# Patient Record
Sex: Male | Born: 1955 | Race: White | Hispanic: No | Marital: Married | Smoking: Never smoker
Health system: Southern US, Community
[De-identification: ages and names within clinical notes are randomized; demographics above are authoritative.]

## PROBLEM LIST (undated history)

## (undated) DIAGNOSIS — E039 Hypothyroidism, unspecified: Secondary | ICD-10-CM

## (undated) DIAGNOSIS — E785 Hyperlipidemia, unspecified: Secondary | ICD-10-CM

## (undated) DIAGNOSIS — I10 Essential (primary) hypertension: Secondary | ICD-10-CM

## (undated) DIAGNOSIS — K635 Polyp of colon: Secondary | ICD-10-CM

## (undated) DIAGNOSIS — Q2547 Right aortic arch: Secondary | ICD-10-CM

## (undated) DIAGNOSIS — Z87442 Personal history of urinary calculi: Secondary | ICD-10-CM

## (undated) HISTORY — PX: COLONOSCOPY: SHX174

## (undated) HISTORY — DX: Right aortic arch: Q25.47

## (undated) HISTORY — DX: Polyp of colon: K63.5

## (undated) HISTORY — DX: Hyperlipidemia, unspecified: E78.5

## (undated) HISTORY — DX: Hypothyroidism, unspecified: E03.9

## (undated) HISTORY — DX: Essential (primary) hypertension: I10

---
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
## 1985-11-15 HISTORY — PX: VASECTOMY: SHX75

## 2000-11-15 HISTORY — PX: LUMBAR LAMINECTOMY: SHX95

## 2002-01-13 DIAGNOSIS — K635 Polyp of colon: Secondary | ICD-10-CM

## 2002-01-13 HISTORY — DX: Polyp of colon: K63.5

## 2010-05-29 ENCOUNTER — Ambulatory Visit (HOSPITAL_COMMUNITY): Admission: RE | Admit: 2010-05-29 | Discharge: 2010-05-29 | Payer: Self-pay | Admitting: Orthopedic Surgery

## 2011-07-12 ENCOUNTER — Encounter: Payer: Self-pay | Admitting: Cardiology

## 2011-07-13 ENCOUNTER — Encounter: Payer: Self-pay | Admitting: Cardiology

## 2011-07-13 ENCOUNTER — Ambulatory Visit (INDEPENDENT_AMBULATORY_CARE_PROVIDER_SITE_OTHER): Payer: PRIVATE HEALTH INSURANCE | Admitting: Cardiology

## 2011-07-13 VITALS — BP 152/104 | HR 71 | Resp 16 | Ht 71.0 in | Wt 186.0 lb

## 2011-07-13 DIAGNOSIS — R0602 Shortness of breath: Secondary | ICD-10-CM

## 2011-07-13 DIAGNOSIS — I10 Essential (primary) hypertension: Secondary | ICD-10-CM

## 2011-07-13 DIAGNOSIS — R0609 Other forms of dyspnea: Secondary | ICD-10-CM

## 2011-07-13 DIAGNOSIS — R0600 Dyspnea, unspecified: Secondary | ICD-10-CM

## 2011-07-13 DIAGNOSIS — R0989 Other specified symptoms and signs involving the circulatory and respiratory systems: Secondary | ICD-10-CM

## 2011-07-13 NOTE — Patient Instructions (Signed)
Your physician has requested that you have an exercise tolerance test. For further information please visit [email or web address] Please also follow instruction sheet, as given.  Please have lab work drawn today.  The current medical regimen is effective;  continue present plan and medications.

## 2011-07-13 NOTE — Assessment & Plan Note (Signed)
The blood pressure continues to be high. I have instructed the patient to record a blood pressure diary and recording this. This will be presented for my review and pending these results I will make further suggestions about changes in therapy for optimal blood pressure control.  

## 2011-07-13 NOTE — Progress Notes (Signed)
HPI  No Known Allergies  No current outpatient prescriptions on file.    Past Medical History  Diagnosis Date  . SOB (shortness of breath) on exertion   . Hyperlipidemia   . Colon polyps   . Kidney stones   . Hypothyroidism     Past Surgical History  Procedure Date  . Pelvic fracture surgery   . Vasectomy 1987  . Lumbar laminectomy 2002    Family History  Problem Relation Age of Onset  . Prostate cancer Other   . Breast cancer Other   . Pancreatic cancer Other   . Breast cancer Mother   . Hypertension Mother   . Ulcers Mother   . Ulcers Father   . Prostate cancer Father     History   Social History  . Marital Status: Married    Spouse Name: N/A    Number of Children: 3  . Years of Education: N/A   Occupational History  . 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 Smoking status: Never Smoker   . Smokeless tobacco: Never Used  . Alcohol Use: Not on file  . Drug Use: Not on file  . Sexually Active: Not on file   Other Topics Concern  . Not on file   Social History Narrative  . No narrative on file    ROS:  As stated in the HPI and negative for all other systems.  PHYSICAL EXAM BP 152/104  Pulse 71  Resp 16  Ht 5\' 11"  (1.803 m)  Wt 186 lb (84.369 kg)  BMI 25.94 kg/m2 GENERAL:  Well appearing HEENT:  Pupils equal round and reactive, fundi not visualized, oral mucosa unremarkable NECK:  No jugular venous distention, waveform within normal limits, carotid upstroke brisk and symmetric, no bruits, no thyromegaly LYMPHATICS:  No cervical, inguinal adenopathy LUNGS:  Clear to auscultation bilaterally BACK:  No CVA tenderness CHEST:  Unremarkable HEART:  PMI not displaced or sustained,S1 and S2 within normal limits, no S3, no S4, no clicks, no rubs, no murmurs ABD:  Flat, positive bowel sounds normal in frequency in pitch, no bruits, no rebound, no guarding, no midline pulsatile mass, no hepatomegaly, no splenomegaly EXT:  2 plus pulses  throughout, no edema, no cyanosis no clubbing SKIN:  No rashes no nodules NEURO:  Cranial nerves II through XII grossly intact, motor grossly intact throughout PSYCH:  Cognitively intact, oriented to person place and time  EKG:  Sinus rhythm, rate 58, axis leftward, intervals within normal limits, no acute ST-T wave changes.  ASSESSMENT AND PLAN

## 2011-07-13 NOTE — Assessment & Plan Note (Signed)
I do not suspect obstructive CAD but I would like to evaluate with an ETT.  In particular I am concerned about his blood pressure response with exercise.  I will arrange to bring him back for this.  I will also check a BNP level.

## 2011-07-17 HISTORY — PX: OTHER SURGICAL HISTORY: SHX169

## 2011-07-23 ENCOUNTER — Ambulatory Visit (INDEPENDENT_AMBULATORY_CARE_PROVIDER_SITE_OTHER): Payer: Self-pay | Admitting: Cardiology

## 2011-07-23 DIAGNOSIS — I10 Essential (primary) hypertension: Secondary | ICD-10-CM

## 2011-07-23 DIAGNOSIS — R0600 Dyspnea, unspecified: Secondary | ICD-10-CM

## 2011-07-23 DIAGNOSIS — R0609 Other forms of dyspnea: Secondary | ICD-10-CM

## 2011-07-23 MED ORDER — HYDROCHLOROTHIAZIDE 12.5 MG PO CAPS: 12.5000 mg | ORAL_CAPSULE | Freq: Every day | ORAL | Status: DC

## 2011-07-23 NOTE — Patient Instructions (Signed)
Please start Chlorthalidone 12.5 mg once a day for blood pressure.  You are being scheduled for a Ca Score.  You will be given instructions and the date and time at check out.

## 2011-07-23 NOTE — Progress Notes (Signed)
Exercise Treadmill Test  Pre-Exercise Testing Evaluation Rhythm: normal sinus  Rate: 76   PR:  .16 QRS:  .10  QT:  .38 QTc: .42     Test  Exercise Tolerance Test Ordering MD: Angelina Sheriff, MD  Interpreting MD:  Angelina Sheriff, MD  Unique Test No: 1  Treadmill:  1  Indication for ETT: exertional dyspnea  Contraindication to ETT: No   Stress Modality: exercise - treadmill  Cardiac Imaging Performed: non   Protocol: standard Bruce - maximal  Max BP:  225/108  Max MPHR (bpm):  165 85% MPR (bpm):  140  MPHR obtained (bpm):  169 % MPHR obtained:  102  Reached 85% MPHR (min:sec):  7:26 Total Exercise Time (min-sec):  10:47  Workload in METS:  14.8 Borg Scale: 17  Reason ETT Terminated:  desired heart rate attained    ST Segment Analysis At Rest: normal ST segments - no evidence of significant ST depression With Exercise: no evidence of significant ST depression  Other Information Arrhythmia:  No Angina during ETT:  absent (0) Quality of ETT:  diagnostic  ETT Interpretation:  normal - no evidence of ischemia by ST analysis  Comments: I used an accelerated Bruce protocol.  The patient had an excellent exercise tolerance.  There was no chest pain. There were no arrhythmias and a normal heart rate response.  There were no ischemic ST T wave changes and a normal heart rate recovery.  He did have an accelerated BP response especially at the highest level of exercise.  In stage 5 when his heart rate reached the peak he had a sudden spike in his BP and acute dyspnea.  His BP remained somewhat elevated but came down with recovery.  His dyspnea resolved very quickly  Recommendations: Negative adequate ETT.  I see no evidence of ischemia.  However, he did have acute dyspnea.  This may be related to his BP response.  I reviewed his resting readings and they are more often above 140/90 than not.  Given this, the symptoms and the response with exercise I will begin him on a low dose of chlorthalidone.   I have prescribed a lower level of exercise than he was doing but increased frequency during the week.  He will keep a BP diary and check it with or immediately after exercise.  Though I see no evidence of obstructive CAD, I have suggested a CT calcium score because of the symptoms.  Finally, if this continues in the future I will order CPX testing.

## 2011-07-25 ENCOUNTER — Emergency Department (HOSPITAL_COMMUNITY): Payer: PRIVATE HEALTH INSURANCE

## 2011-07-25 ENCOUNTER — Emergency Department (HOSPITAL_COMMUNITY)
Admission: EM | Admit: 2011-07-25 | Discharge: 2011-07-25 | Disposition: A | Discharge status: Home / Self Care | Payer: PRIVATE HEALTH INSURANCE | Attending: Emergency Medicine | Admitting: Emergency Medicine

## 2011-07-25 DIAGNOSIS — Y92838 Other recreation area as the place of occurrence of the external cause: Secondary | ICD-10-CM | POA: Insufficient documentation

## 2011-07-25 DIAGNOSIS — E039 Hypothyroidism, unspecified: Secondary | ICD-10-CM | POA: Insufficient documentation

## 2011-07-25 DIAGNOSIS — S52509A Unspecified fracture of the lower end of unspecified radius, initial encounter for closed fracture: Secondary | ICD-10-CM | POA: Insufficient documentation

## 2011-07-25 DIAGNOSIS — Y9322 Activity, ice hockey: Secondary | ICD-10-CM | POA: Insufficient documentation

## 2011-07-25 DIAGNOSIS — I10 Essential (primary) hypertension: Secondary | ICD-10-CM | POA: Insufficient documentation

## 2011-07-25 DIAGNOSIS — Y9239 Other specified sports and athletic area as the place of occurrence of the external cause: Secondary | ICD-10-CM | POA: Insufficient documentation

## 2011-07-25 DIAGNOSIS — W010XXA Fall on same level from slipping, tripping and stumbling without subsequent striking against object, initial encounter: Secondary | ICD-10-CM | POA: Insufficient documentation

## 2011-07-25 LAB — BASIC METABOLIC PANEL
BUN: 22 mg/dL (ref 6–23)
Calcium: 9.9 mg/dL (ref 8.4–10.5)
GFR calc Af Amer: >60 mL/min (ref >60)
GFR calc non Af Amer: 55 mL/min — ABNORMAL LOW (ref >60)
Glucose, Bld: 108 mg/dL — ABNORMAL HIGH (ref 70–99)

## 2011-07-25 LAB — DIFFERENTIAL
Lymphocytes Relative: 8 % — ABNORMAL LOW (ref 12–46)
Monocytes Absolute: 1.1 10*3/uL — ABNORMAL HIGH (ref 0.1–1.0)
Neutro Abs: 13.1 10*3/uL — ABNORMAL HIGH (ref 1.7–7.7)
Neutrophils Relative %: 84 % — ABNORMAL HIGH (ref 43–77)

## 2011-07-25 LAB — PROTIME-INR
INR: 0.94 (ref 0.00–1.49)
Prothrombin Time: 12.8 seconds (ref 11.6–15.2)

## 2011-07-25 LAB — CBC
HCT: 47.9 % (ref 39.0–52.0)
MCH: 31.6 pg (ref 26.0–34.0)
MCHC: 35.7 g/dL (ref 30.0–36.0)
Platelets: 182 10*3/uL (ref 150–400)
RDW: 12.9 % (ref 11.5–15.5)

## 2011-07-27 ENCOUNTER — Ambulatory Visit (HOSPITAL_BASED_OUTPATIENT_CLINIC_OR_DEPARTMENT_OTHER)
Admission: RE | Admit: 2011-07-27 | Discharge: 2011-07-27 | Disposition: A | Discharge status: Home / Self Care | Payer: PRIVATE HEALTH INSURANCE | Source: Ambulatory Visit | Attending: Orthopedic Surgery | Admitting: Orthopedic Surgery

## 2011-07-27 DIAGNOSIS — S52509A Unspecified fracture of the lower end of unspecified radius, initial encounter for closed fracture: Secondary | ICD-10-CM | POA: Insufficient documentation

## 2011-07-27 DIAGNOSIS — Y9322 Activity, ice hockey: Secondary | ICD-10-CM | POA: Insufficient documentation

## 2011-07-27 DIAGNOSIS — S52609A Unspecified fracture of lower end of unspecified ulna, initial encounter for closed fracture: Secondary | ICD-10-CM | POA: Insufficient documentation

## 2011-07-27 DIAGNOSIS — Y998 Other external cause status: Secondary | ICD-10-CM | POA: Insufficient documentation

## 2011-07-27 DIAGNOSIS — W010XXA Fall on same level from slipping, tripping and stumbling without subsequent striking against object, initial encounter: Secondary | ICD-10-CM | POA: Insufficient documentation

## 2011-07-27 DIAGNOSIS — Y9229 Other specified public building as the place of occurrence of the external cause: Secondary | ICD-10-CM | POA: Insufficient documentation

## 2011-07-28 NOTE — Consult Note (Signed)
NAMEJANOAH, William Wang NO.:  [id number]  MEDICAL RECORD NO.:  [id number]  LOCATION:  [facility]                         FACILITY:  [facility]  PHYSICIAN:  Betha Loa, MD        DATE OF BIRTH:  [birth year]  DATE OF CONSULTATION:  07/25/2011 DATE OF DISCHARGE:  07/25/2011                                CONSULTATION   Consult is from Dr. Effie Shy.  REASON FOR CONSULTATION:  Right distal radius fracture.  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  He had pain and deformity in the right wrist.  He came to [facility] Emergency Department where he was evaluated.  He was found to have a distal radius fracture and I was consulted for management of injury.  He reports he was wearing pads and gloves when he fell.  He reports previous gamekeeper's thumb injury approximately 24 years ago, but no other injuries to the right upper extremity.  He complains of no other injuries at this time.  ALLERGIES:  No known drug allergies.  PAST MEDICAL HISTORY:  Borderline hypothyroid and newly diagnosed hypertension.  PAST SURGICAL HISTORY:  Lumbar laminectomy in 2003 and vasectomy.  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  He does not smoke and has a glass of wine a day.  FAMILY HISTORY:  Positive for cancer, coronary artery disease, and ulcers.  REVIEW OF SYSTEMS:  13-point review of systems negative.  PHYSICAL EXAMINATION:  VITAL SIGNS:  Temperature 98.7, pulse 66, respirations 16, BP 144/94. HEAD:  Normocephalic, atraumatic. NECK:  Supple.  Full range of motion.  He is resting comfortably in the hospital stretcher. EXTREMITIES:  Bilateral upper extremities are intact to light touch sensation and capillary refill in all fingertips.  He can flex & extend the IP joints of his thumbs and can cross and abduct his fingers.  Left upper extremity is  without wounds without tenderness to palpation.  In the right upper extremity, he has a very small punctate wound, about the size of a 25 gauge needle, that was slowly bleeding at the ulnar side of the wrist.  There are no other wounds.  He is tender at the wrist, but not in the digits, hand, forearm, elbow, or shoulder.  There is visible deformity at the wrist.  RADIOGRAPHIC STUDIES:  AP and lateral oblique views of the right wrist show a comminuted fracture of the distal radius and ulna.  There is an extra-articular fracture of the radius with metaphyseal comminution. There is mild dorsal displacement and angulation.  There is minimal displacement at the ulna.  ASSESSMENT/PLAN:  Right comminuted distal radius fracture and distal ulna fracture.  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  He has been given a gram of IV Ancef by the emergency department and his tetanus is up to date.  We discussed the nature of this injury.  Would recommend p.o. antibiotics and scheduling for surgery within the next couple of days at the outpatient surgical center.  Risks, benefits, and alternatives  of surgery were discussed and he wished to proceed and agreed with plan of care.  A sugar-tong splint was placed, which he states is comfortable and well fitting.  Fingertips were pink with brisk capillary refill after splinting.  We will call him tomorrow with surgical planning.     Betha Loa, MD     KK/MEDQ  D:  07/25/2011  T:  07/26/2011  Job:  [id number]  Electronically Signed by Betha Loa  on 07/28/2011 02:29:38 PM

## 2011-07-28 NOTE — Op Note (Signed)
NAMECAMBREN, HELM NO.:  [id number]  MEDICAL RECORD NO.:  [id number]  LOCATION:                                 FACILITY:  PHYSICIAN:  Betha Loa, MD        DATE OF BIRTH:  [birth year]  DATE OF PROCEDURE:  07/27/2011 DATE OF DISCHARGE:                              OPERATIVE REPORT   PREOPERATIVE DIAGNOSIS:  Right distal radius and ulna fractures.  POSTOPERATIVE DIAGNOSIS:  Right distal radius and ulna fractures.  PROCEDURE:  Open reduction and internal fixation right distal radius fracture and closed reduction and percutaneous pinning of right distal ulna fracture.  SURGEON:  Betha Loa, MD  ASSISTANT:  Cindee Salt, MD  ANESTHESIA:  General with regional IV fluids per anesthesia flow sheet.  ESTIMATED BLOOD LOSS:  Minimal.  COMPLICATIONS:  None.  SPECIMENS:  None.  TOURNIQUET TIME:  88 minutes.  DISPOSITION:  Stable to PACU.  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  He came to the [facility] Emergency Department where radiographs were taken revealing a right distal radius and ulna fracture.  I saw him in the emergency department.  A sugar-tong splint was placed.  We discussed the nature of the injury.  I recommended going to the operating room for open reduction and internal fixation of the fracture.  Risks, benefits and alternatives of surgery were discussed including risk of blood loss, infection, damage to nerves, vessels, tendons, ligaments or bone, failure to surgery, need for additional surgery, complications with wound healing, continued pain, nonunion, malunion, stiffness.  He voiced understanding of these risks and elected to proceed.  OPERATIVE COURSE:  After being identified preoperatively by myself, the patient & I agreed upon the procedure and site of procedure.  The surgical site was marked.  The risks, benefits and alternatives of surgery were reviewed and he wished to proceed.   Surgical consent has been signed. He was given a gram of IV Ancef as preoperative antibiotic prophylaxis. A regional block was performed by Anesthesia in preoperative holding. He was transferred to the operating room and placed in the operating room table in supine position with the right upper extremity on an armboard.  General anesthesia was induced by the anesthesiologist.  The right upper extremity was prepped and draped in normal sterile orthopedic fashion.  A surgical pause was performed between surgeons, Anesthesia, and operating staff and all were in agreement as to the patient, procedure and site of procedure.  Tourniquet at the proximal aspect of the extremity was inflated to 250 mmHg after exsanguination of the limb with an Esmarch bandage.  Standard volar Sherilyn Cooter approach was used.  The superficial and deep portions of the FCR were incised and the FCR retracted ulnarly to protect the palmar cutaneous branch of the median nerve.  The FPL was swept ulnarly as well.  The pronator quadratus was released from the radial side of the radius and elevated with the periosteal elevator.  The fracture site was easily identified. There was no intra-articular comminution, but there was a large butterfly fragment on the radial side.  Brachioradialis was released  to aid in reduction.  The butterfly fragment was reduced and held with a bone clamp.  Two screws were placed from the radial side of the radius across to the ulnar side of the radius.  These were cortical screws.  This secured the fragment well.  Reduction of the articular fragment was performed.  A volar distal radial locking plate was selected and secured to the bone with the guidepins.  C-arm was used in AP, lateral, and oblique projections to ensure appropriate reduction and placement of the hardware which was the case.  The first screw placed  was the proximal most screw in the shaft of the plate prior to placing the distal  screws.  The distal holes in the plate were filled using standard  AO drilling and measuring technique.  Smooth pegs were used in all except  for the two radial styloid holes, which were filled with locking screws.   This stabilized the fracture well.  The remaining holes in the shaft of  the plate were then filled with nonlocking screws.  C-arm was again used  in AP, lateral, and oblique projections to ensure appropriate reduction and placement of hardware, which was the case.  There was no intra-articular penetration.  The distal ulna was then addressed.  A 0.062-inch K-wire was then advanced from the ulnar styloid down the shaft of the ulna.  An additional 0.54- inch K-wire was then advanced in a crossed fashion into the head of the ulna.  This was adequate to stabilize the ulnar fracture.  A 0.062-inch K-wire had been used in the distal radius to aid in stabilization, this had been removed already.  The wounds were then copiously irrigated with sterile saline.  The pins were cut, so that the end would be underneath the skin.  The one through the styloid was bent, so that would not migrate into the shaft of the ulna.  Copious irrigation of the surgicalwound was performed.  The FPL and pronator quadratus were repaired back over top of the plate using 4-0 Vicryl suture in a figure-of-eight fashion.  Three inverted Vicryl sutures were placed in the subcutaneous tissues and the skin was closed with 4-0 nylon in a horizontal mattress fashion.  The wound was dressed with sterile Xeroform and 4x4s, and wrapped with Kerlix.  A sugar-tong splint was placed and wrapped with Kerlix and Ace bandage.  Tourniquet was deflated at 88 minutes.  The fingertips were pink with brisk capillary refill after deflation of the tourniquet.  The operative drapes were broken down.  The patient was awoken from anesthesia safely.  He was transferred back to the stretcher and taken to PACU in stable condition.  I will  give him Percocet 5/325 one to two p.o. q.6 h. p.r.n. pain, dispensed number of 40 and I will see him back in the office in 1 week for postoperative followup.     Betha Loa, MD     KK/MEDQ  D:  07/27/2011  T:  07/28/2011  Job:  [id number]  Electronically Signed by Betha Loa  on 07/28/2011 02:32:40 PM

## 2011-08-05 ENCOUNTER — Encounter: Payer: Self-pay | Admitting: Cardiology

## 2011-08-19 ENCOUNTER — Ambulatory Visit (HOSPITAL_COMMUNITY)
Admission: RE | Admit: 2011-08-19 | Discharge: 2011-08-19 | Disposition: A | Discharge status: Home / Self Care | Payer: PRIVATE HEALTH INSURANCE | Source: Ambulatory Visit | Attending: Cardiology | Admitting: Cardiology

## 2011-08-19 ENCOUNTER — Other Ambulatory Visit: Payer: Self-pay | Admitting: *Deleted

## 2011-08-19 ENCOUNTER — Encounter (HOSPITAL_COMMUNITY): Payer: Self-pay

## 2011-08-19 DIAGNOSIS — R0600 Dyspnea, unspecified: Secondary | ICD-10-CM

## 2011-08-19 DIAGNOSIS — R9389 Abnormal findings on diagnostic imaging of other specified body structures: Secondary | ICD-10-CM | POA: Insufficient documentation

## 2011-08-19 DIAGNOSIS — I10 Essential (primary) hypertension: Secondary | ICD-10-CM

## 2011-08-19 MED ORDER — CHLORTHALIDONE 25 MG PO TABS: 12.5000 mg | ORAL_TABLET | Freq: Every day | ORAL | Status: DC

## 2011-08-24 ENCOUNTER — Encounter: Payer: Self-pay | Admitting: Cardiology

## 2011-11-18 ENCOUNTER — Encounter: Payer: Self-pay | Admitting: Gastroenterology

## 2011-12-15 ENCOUNTER — Encounter: Payer: Self-pay | Admitting: Internal Medicine

## 2011-12-15 ENCOUNTER — Ambulatory Visit (AMBULATORY_SURGERY_CENTER): Payer: PRIVATE HEALTH INSURANCE | Admitting: *Deleted

## 2011-12-15 VITALS — Ht 71.0 in | Wt 185.3 lb

## 2011-12-15 DIAGNOSIS — Z1211 Encounter for screening for malignant neoplasm of colon: Secondary | ICD-10-CM

## 2011-12-15 MED ORDER — PEG-KCL-NACL-NASULF-NA ASC-C 100 G PO SOLR: ORAL | Status: DC

## 2011-12-22 ENCOUNTER — Other Ambulatory Visit: Payer: Self-pay | Admitting: Gastroenterology

## 2011-12-23 ENCOUNTER — Ambulatory Visit (AMBULATORY_SURGERY_CENTER): Payer: PRIVATE HEALTH INSURANCE | Admitting: Internal Medicine

## 2011-12-23 ENCOUNTER — Encounter: Payer: Self-pay | Admitting: Internal Medicine

## 2011-12-23 VITALS — BP 136/90 | HR 48 | Temp 96.9°F | Resp 18 | Ht 71.0 in | Wt 185.0 lb

## 2011-12-23 DIAGNOSIS — K5730 Diverticulosis of large intestine without perforation or abscess without bleeding: Secondary | ICD-10-CM

## 2011-12-23 DIAGNOSIS — Z1211 Encounter for screening for malignant neoplasm of colon: Secondary | ICD-10-CM

## 2011-12-23 MED ORDER — SODIUM CHLORIDE 0.9 % IV SOLN: 500.0000 mL | INTRAVENOUS | Status: DC

## 2011-12-23 NOTE — Patient Instructions (Addendum)
There was diverticulosis in the left colon but no polyps or cancer seen. Next routine colonoscopy in about 10 years. Iva Boop, MD, [facility] FOLLOW DISCHARGE INSTRUCTIONS [facility] AND GREEN SHEETS).

## 2011-12-23 NOTE — Progress Notes (Signed)
Patient did not experience any of the following events: a burn prior to discharge; a fall within the facility; wrong site/side/patient/procedure/implant event; or a hospital transfer or hospital admission upon discharge from the facility. (G8907) Patient did not have preoperative order for IV antibiotic SSI prophylaxis. (G8918) Patient did not have preoperative order for IV antibiotic SSI prophylaxis. (G8918)  

## 2011-12-23 NOTE — Progress Notes (Signed)
Pt states he has a low heart rate normally. ewm

## 2011-12-23 NOTE — Op Note (Signed)
[facility] [address] [place], [place]  [address]  COLONOSCOPY PROCEDURE REPORT  PATIENT:  William Wang, William Wang  MR#:  [id number] BIRTHDATE:  08/07/1956, 55 yrs. old  GENDER:  male ENDOSCOPIST:  Iva Boop, MD, [facility] REF. BY:  Chilton Greathouse, M.D. PROCEDURE DATE:  12/23/2011 PROCEDURE:  Colonoscopy 86578 ASA CLASS:  Class II INDICATIONS:  Routine Risk Screening MEDICATIONS:   These medications were titrated to patient response per physician's verbal order, Fentanyl 50 mcg IV, Versed 5 mg  DESCRIPTION OF PROCEDURE:   After the risks benefits and alternatives of the procedure were thoroughly explained, informed consent was obtained.  Digital rectal exam was performed and revealed no rectal masses and an enlarged prostate.  Mildly enlarged prostate without nodules (previously noted by Dr. Felipa Eth also). The LB 180AL [id number] endoscope was introduced through the anus and advanced to the cecum, which was identified by both the appendix and ileocecal valve, without limitations.  The quality of the prep was good, using MoviPrep.  The instrument was then slowly withdrawn as the colon was fully examined. <<PROCEDUREIMAGES>>  FINDINGS:  Moderate diverticulosis was found in the left colon. This was otherwise a normal examination of the colon. Includes right colon retroflexion.   Retroflexed views in the rectum revealed no abnormalities.    The time to cecum = 2:27 minutes. The scope was then withdrawn in 14:13 minutes from the cecum and the procedure completed. COMPLICATIONS:  None ENDOSCOPIC IMPRESSION: 1) Moderate diverticulosis in the left colon 2) Otherwise normal examination, good prep  REPEAT EXAM:  In 10 year(s) for routine screening colonoscopy.  Iva Boop, MD, [facility]  CC:  Chilton Greathouse, MD and The Patient  n. eSIGNED:   Iva Boop at 12/23/2011 09:24 AM  Dione Booze, [phone]

## 2011-12-24 ENCOUNTER — Telehealth: Payer: Self-pay | Admitting: *Deleted

## 2011-12-24 NOTE — Telephone Encounter (Signed)
  Follow up Call-  Call back number 12/23/2011  Post procedure Call Back phone  # [phone]  Permission to leave phone message Yes     Patient questions:  Do you have a fever, pain , or abdominal swelling? no Pain Score  0 *  Have you tolerated food without any problems? yes  Have you been able to return to your normal activities? yes  Do you have any questions about your discharge instructions: Diet   no Medications  no Follow up visit  no  Do you have questions or concerns about your Care? no  Actions: * If pain score is 4 or above: No action needed, pain <4.

## 2012-10-23 ENCOUNTER — Other Ambulatory Visit: Payer: Self-pay | Admitting: *Deleted

## 2012-10-23 DIAGNOSIS — I10 Essential (primary) hypertension: Secondary | ICD-10-CM

## 2012-10-23 MED ORDER — CHLORTHALIDONE 25 MG PO TABS: 12.5000 mg | ORAL_TABLET | Freq: Every day | ORAL | Status: DC

## 2012-10-25 ENCOUNTER — Other Ambulatory Visit: Payer: Self-pay

## 2012-10-25 DIAGNOSIS — I10 Essential (primary) hypertension: Secondary | ICD-10-CM

## 2012-10-25 MED ORDER — CHLORTHALIDONE 25 MG PO TABS: 12.5000 mg | ORAL_TABLET | Freq: Every day | ORAL | Status: DC

## 2012-10-25 NOTE — Telephone Encounter (Signed)
..   Requested Prescriptions   Signed Prescriptions Disp Refills  . chlorthalidone (HYGROTON) 25 MG tablet 15 tablet 1    Sig: Take 0.5 tablets (12.5 mg total) by mouth daily.    Authorizing Provider: Rollene Rotunda    Ordering User: Christella Hartigan, Kyrstal Monterrosa Judie Petit

## 2013-01-02 ENCOUNTER — Other Ambulatory Visit: Payer: Self-pay

## 2013-01-02 DIAGNOSIS — I10 Essential (primary) hypertension: Secondary | ICD-10-CM

## 2013-01-02 MED ORDER — CHLORTHALIDONE 25 MG PO TABS: 12.5000 mg | ORAL_TABLET | Freq: Every day | ORAL | Status: DC

## 2013-01-02 NOTE — Telephone Encounter (Signed)
..   Requested Prescriptions   Signed Prescriptions Disp Refills  . chlorthalidone (HYGROTON) 25 MG tablet 45 tablet 3    Sig: Take 0.5 tablets (12.5 mg total) by mouth daily.    Authorizing Provider: Rollene Rotunda    Ordering User: Vilma Will M  refilled per Dr Antoine Poche

## 2015-10-27 ENCOUNTER — Other Ambulatory Visit (HOSPITAL_COMMUNITY): Payer: Self-pay | Admitting: Orthopedic Surgery

## 2015-10-27 ENCOUNTER — Ambulatory Visit (HOSPITAL_COMMUNITY)
Admission: RE | Admit: 2015-10-27 | Discharge: 2015-10-27 | Disposition: A | Discharge status: Home / Self Care | Payer: PRIVATE HEALTH INSURANCE | Source: Ambulatory Visit | Attending: Orthopedic Surgery | Admitting: Orthopedic Surgery

## 2015-10-27 DIAGNOSIS — M5489 Other dorsalgia: Secondary | ICD-10-CM | POA: Diagnosis not present

## 2015-10-27 DIAGNOSIS — R200 Anesthesia of skin: Secondary | ICD-10-CM | POA: Diagnosis not present

## 2015-10-27 DIAGNOSIS — M4807 Spinal stenosis, lumbosacral region: Secondary | ICD-10-CM | POA: Diagnosis not present

## 2015-10-27 DIAGNOSIS — M5136 Other intervertebral disc degeneration, lumbar region: Secondary | ICD-10-CM | POA: Diagnosis not present

## 2015-10-27 DIAGNOSIS — N400 Enlarged prostate without lower urinary tract symptoms: Secondary | ICD-10-CM | POA: Insufficient documentation

## 2015-10-27 DIAGNOSIS — M4806 Spinal stenosis, lumbar region: Secondary | ICD-10-CM | POA: Diagnosis not present

## 2015-10-27 DIAGNOSIS — M545 Low back pain: Secondary | ICD-10-CM | POA: Diagnosis not present

## 2015-10-29 ENCOUNTER — Other Ambulatory Visit: Payer: Self-pay | Admitting: Internal Medicine

## 2015-10-29 ENCOUNTER — Other Ambulatory Visit (HOSPITAL_COMMUNITY): Payer: Self-pay | Admitting: Orthopedic Surgery

## 2015-10-29 DIAGNOSIS — M5116 Intervertebral disc disorders with radiculopathy, lumbar region: Secondary | ICD-10-CM

## 2015-10-29 DIAGNOSIS — M4807 Spinal stenosis, lumbosacral region: Secondary | ICD-10-CM

## 2015-11-04 ENCOUNTER — Other Ambulatory Visit: Payer: Self-pay | Admitting: Internal Medicine

## 2015-11-04 ENCOUNTER — Ambulatory Visit
Admission: RE | Admit: 2015-11-04 | Discharge: 2015-11-04 | Disposition: A | Discharge status: Home / Self Care | Payer: PRIVATE HEALTH INSURANCE | Source: Ambulatory Visit | Attending: Internal Medicine | Admitting: Internal Medicine

## 2015-11-04 DIAGNOSIS — M5116 Intervertebral disc disorders with radiculopathy, lumbar region: Secondary | ICD-10-CM

## 2015-11-04 MED ORDER — IOHEXOL 180 MG/ML  SOLN
1.0000 mL | Freq: Once | INTRAMUSCULAR | Status: AC | PRN
  Administered 2015-11-04: 1 mL via EPIDURAL

## 2015-11-04 MED ORDER — METHYLPREDNISOLONE ACETATE 40 MG/ML INJ SUSP (RADIOLOG
120.0000 mg | Freq: Once | INTRAMUSCULAR | Status: AC
Start: 2015-11-04 — End: 2015-11-04
  Administered 2015-11-04: 120 mg via EPIDURAL

## 2015-11-04 NOTE — Discharge Instructions (Signed)

## 2015-11-11 DIAGNOSIS — M5116 Intervertebral disc disorders with radiculopathy, lumbar region: Secondary | ICD-10-CM | POA: Insufficient documentation

## 2015-11-16 HISTORY — PX: LUMBAR MICRODISCECTOMY: SHX99

## 2015-11-28 ENCOUNTER — Other Ambulatory Visit: Payer: Self-pay | Admitting: Neurological Surgery

## 2015-11-28 DIAGNOSIS — G8929 Other chronic pain: Secondary | ICD-10-CM

## 2015-11-28 DIAGNOSIS — M545 Low back pain: Principal | ICD-10-CM

## 2015-12-09 ENCOUNTER — Ambulatory Visit
Admission: RE | Admit: 2015-12-09 | Discharge: 2015-12-09 | Disposition: A | Discharge status: Home / Self Care | Payer: PRIVATE HEALTH INSURANCE | Source: Ambulatory Visit | Attending: Neurological Surgery | Admitting: Neurological Surgery

## 2015-12-09 ENCOUNTER — Encounter: Payer: Self-pay | Admitting: Radiology

## 2015-12-09 DIAGNOSIS — M545 Low back pain: Principal | ICD-10-CM

## 2015-12-09 DIAGNOSIS — G8929 Other chronic pain: Secondary | ICD-10-CM

## 2015-12-09 MED ORDER — METHYLPREDNISOLONE ACETATE 40 MG/ML INJ SUSP (RADIOLOG
120.0000 mg | Freq: Once | INTRAMUSCULAR | Status: AC
  Administered 2015-12-09: 120 mg via EPIDURAL

## 2015-12-09 MED ORDER — IOHEXOL 180 MG/ML  SOLN
1.0000 mL | Freq: Once | INTRAMUSCULAR | Status: AC | PRN
  Administered 2015-12-09: 1 mL via EPIDURAL

## 2015-12-09 NOTE — Discharge Instructions (Signed)

## 2017-01-18 ENCOUNTER — Encounter (HOSPITAL_COMMUNITY): Payer: Self-pay | Admitting: *Deleted

## 2017-02-01 ENCOUNTER — Other Ambulatory Visit (HOSPITAL_COMMUNITY): Payer: Self-pay | Admitting: Emergency Medicine

## 2017-02-01 NOTE — Patient Instructions (Signed)
William Wang  [birth year]   Your procedure is scheduled on: 02-08-17  Report to [facility] Main  Entrance take [facility]  elevators to 3rd floor to  [place] at [phone].  Call this number if you have problems the morning of surgery [phone]   Remember: ONLY 1 PERSON MAY GO WITH YOU TO SHORT STAY TO GET  READY MORNING OF [place].  Do not eat food or drink liquids :After Midnight.     Take these medicines the morning of surgery with A SIP OF WATER: rosuvastatin(crestor), tylenol as needed                                You may not have any metal on your body including hair pins and              piercings  Do not wear jewelry, make-up, lotions, powders or perfumes, deodorant             Do not wear nail polish.  Do not shave  48 hours prior to surgery.              Men may shave face and neck.   Do not bring valuables to the hospital. [place].  Contacts, dentures or bridgework may not be worn into surgery.  Leave suitcase in the car. After surgery it may be brought to your room.               Please read over the following fact sheets you were given: _____________________________________________________________________             [facility] - Preparing for Surgery Before surgery, you can play an important role.  Because skin is not sterile, your skin needs to be as free of germs as possible.  You can reduce the number of germs on your skin by washing with CHG (chlorahexidine gluconate) soap before surgery.  CHG is an antiseptic cleaner which kills germs and bonds with the skin to continue killing germs even after washing. Please DO NOT use if you have an allergy to CHG or antibacterial soaps.  If your skin becomes reddened/irritated stop using the CHG and inform your nurse when you arrive at Short Stay. Do not shave (including legs and underarms) for at least 48 hours prior to the first CHG shower.   You may shave your face/neck. Please follow these instructions carefully:  1.  Shower with CHG Soap the night before surgery and the  morning of Surgery.  2.  If you choose to wash your hair, wash your hair first as usual with your  normal  shampoo.  3.  After you shampoo, rinse your hair and body thoroughly to remove the  shampoo.                           4.  Use CHG as you would any other liquid soap.  You can apply chg directly  to the skin and wash                       Gently with a scrungie or clean washcloth.  5.  Apply the CHG Soap to your  body ONLY FROM THE NECK DOWN.   Do not use on face/ open                           Wound or open sores. Avoid contact with eyes, ears mouth and genitals (private parts).                       Wash face,  Genitals (private parts) with your normal soap.             6.  Wash thoroughly, paying special attention to the area where your surgery  will be performed.  7.  Thoroughly rinse your body with warm water from the neck down.  8.  DO NOT shower/wash with your normal soap after using and rinsing off  the CHG Soap.                9.  Pat yourself dry with a clean towel.            10.  Wear clean pajamas.            11.  Place clean sheets on your bed the night of your first shower and do not  sleep with pets. Day of Surgery : Do not apply any lotions/deodorants the morning of surgery.  Please wear clean clothes to the hospital/surgery center.  FAILURE TO FOLLOW THESE INSTRUCTIONS MAY RESULT IN THE CANCELLATION OF YOUR SURGERY PATIENT SIGNATURE_________________________________  NURSE SIGNATURE__________________________________  ________________________________________________________________________   William Wang  An incentive spirometer is a tool that can help keep your lungs clear and active. This tool measures how well you are filling your lungs with each breath. Taking long deep breaths may help reverse or decrease the chance of  developing breathing (pulmonary) problems (especially infection) following:  A long period of time when you are unable to move or be active. BEFORE THE PROCEDURE   If the spirometer includes an indicator to show your best effort, your nurse or respiratory therapist will set it to a desired goal.  If possible, sit up straight or lean slightly forward. Try not to slouch.  Hold the incentive spirometer in an upright position. INSTRUCTIONS FOR USE  1. Sit on the edge of your bed if possible, or sit up as far as you can in bed or on a chair. 2. Hold the incentive spirometer in an upright position. 3. Breathe out normally. 4. Place the mouthpiece in your mouth and seal your lips tightly around it. 5. Breathe in slowly and as deeply as possible, raising the piston or the ball toward the top of the column. 6. Hold your breath for 3-5 seconds or for as long as possible. Allow the piston or ball to fall to the bottom of the column. 7. Remove the mouthpiece from your mouth and breathe out normally. 8. Rest for a few seconds and repeat Steps 1 through 7 at least 10 times every 1-2 hours when you are awake. Take your time and take a few normal breaths between deep breaths. 9. The spirometer may include an indicator to show your best effort. Use the indicator as a goal to work toward during each repetition. 10. After each set of 10 deep breaths, practice coughing to be sure your lungs are clear. If you have an incision (the cut made at the time of surgery), support your incision when coughing by placing a pillow or rolled up towels firmly against it.  Once you are able to get out of bed, walk around indoors and cough well. You may stop using the incentive spirometer when instructed by your caregiver.  RISKS AND COMPLICATIONS  Take your time so you do not get dizzy or light-headed.  If you are in pain, you may need to take or ask for pain medication before doing incentive spirometry. It is harder to take a  deep breath if you are having pain. AFTER USE  Rest and breathe slowly and easily.  It can be helpful to keep track of a log of your progress. Your caregiver can provide you with a simple table to help with this. If you are using the spirometer at home, follow these instructions: [place] IF:   You are having difficultly using the spirometer.  You have trouble using the spirometer as often as instructed.  Your pain medication is not giving enough relief while using the spirometer.  You develop fever of 100.5 F (38.1 C) or higher. SEEK IMMEDIATE MEDICAL CARE IF:   You cough up bloody sputum that had not been present before.  You develop fever of 102 F (38.9 C) or greater.  You develop worsening pain at or near the incision site. MAKE SURE YOU:   Understand these instructions.  Will watch your condition.  Will get help right away if you are not doing well or get worse. Document Released: 03/14/2007 Document Revised: 01/24/2012 Document Reviewed: 05/15/2007 ExitCare Patient Information 2014 Exit[employer], [place].   ________________________________________________________________________  WHAT IS A BLOOD TRANSFUSION? Blood Transfusion Information  A transfusion is the replacement of blood or some of its parts. Blood is made up of multiple cells which provide different functions.  Red blood cells carry oxygen and are used for blood loss replacement.  White blood cells fight against infection.  Platelets control bleeding.  Plasma helps clot blood.  Other blood products are available for specialized needs, such as hemophilia or other clotting disorders. BEFORE THE TRANSFUSION  Who gives blood for transfusions?   Healthy volunteers who are fully evaluated to make sure their blood is safe. This is blood bank blood. Transfusion therapy is the safest it has ever been in the practice of medicine. Before blood is taken from a donor, a complete history is taken to make  sure that person has no history of diseases nor engages in risky social behavior (examples are intravenous drug use or sexual activity with multiple partners). The donor's travel history is screened to minimize risk of transmitting infections, such as malaria. The donated blood is tested for signs of infectious diseases, such as HIV and hepatitis. The blood is then tested to be sure it is compatible with you in order to minimize the chance of a transfusion reaction. If you or a relative donates blood, this is often done in anticipation of surgery and is not appropriate for emergency situations. It takes many days to process the donated blood. RISKS AND COMPLICATIONS Although transfusion therapy is very safe and saves many lives, the main dangers of transfusion include:   Getting an infectious disease.  Developing a transfusion reaction. This is an allergic reaction to something in the blood you were given. Every precaution is taken to prevent this. The decision to have a blood transfusion has been considered carefully by your caregiver before blood is given. Blood is not given unless the benefits outweigh the risks. AFTER THE TRANSFUSION  Right after receiving a blood transfusion, you will usually feel much better and more energetic.  This is especially true if your red blood cells have gotten low (anemic). The transfusion raises the level of the red blood cells which carry oxygen, and this usually causes an energy increase.  The nurse administering the transfusion will monitor you carefully for complications. HOME CARE INSTRUCTIONS  No special instructions are needed after a transfusion. You may find your energy is better. Speak with your caregiver about any limitations on activity for underlying diseases you may have. SEEK MEDICAL CARE IF:   Your condition is not improving after your transfusion.  You develop redness or irritation at the intravenous (IV) site. SEEK IMMEDIATE MEDICAL CARE IF:   Any of the following symptoms occur over the next 12 hours:  Shaking chills.  You have a temperature by mouth above 102 F (38.9 C), not controlled by medicine.  Chest, back, or muscle pain.  People around you feel you are not acting correctly or are confused.  Shortness of breath or difficulty breathing.  Dizziness and fainting.  You get a rash or develop hives.  You have a decrease in urine output.  Your urine turns a dark color or changes to pink, red, or brown. Any of the following symptoms occur over the next 10 days:  You have a temperature by mouth above 102 F (38.9 C), not controlled by medicine.  Shortness of breath.  Weakness after normal activity.  The white part of the eye turns yellow (jaundice).  You have a decrease in the amount of urine or are urinating less often.  Your urine turns a dark color or changes to pink, red, or brown. Document Released: 10/29/2000 Document Revised: 01/24/2012 Document Reviewed: 06/17/2008 [facility] Patient Information 2014 [place], [place].  _______________________________________________________________________

## 2017-02-01 NOTE — Progress Notes (Signed)
Surgical clearance 11-03-16 on chart

## 2017-02-02 ENCOUNTER — Encounter (HOSPITAL_COMMUNITY): Payer: Self-pay

## 2017-02-02 ENCOUNTER — Encounter (HOSPITAL_COMMUNITY)
Admission: RE | Admit: 2017-02-02 | Discharge: 2017-02-02 | Disposition: A | Discharge status: Home / Self Care | Payer: PRIVATE HEALTH INSURANCE | Source: Ambulatory Visit | Attending: Orthopedic Surgery | Admitting: Orthopedic Surgery

## 2017-02-02 DIAGNOSIS — Z01812 Encounter for preprocedural laboratory examination: Secondary | ICD-10-CM | POA: Insufficient documentation

## 2017-02-02 DIAGNOSIS — R001 Bradycardia, unspecified: Secondary | ICD-10-CM | POA: Insufficient documentation

## 2017-02-02 DIAGNOSIS — Z01810 Encounter for preprocedural cardiovascular examination: Secondary | ICD-10-CM | POA: Diagnosis present

## 2017-02-02 DIAGNOSIS — I517 Cardiomegaly: Secondary | ICD-10-CM | POA: Diagnosis not present

## 2017-02-02 DIAGNOSIS — M1611 Unilateral primary osteoarthritis, right hip: Secondary | ICD-10-CM | POA: Diagnosis not present

## 2017-02-02 HISTORY — DX: Personal history of urinary calculi: Z87.442

## 2017-02-02 LAB — SURGICAL PCR SCREEN
MRSA, PCR: NEGATIVE
Staphylococcus aureus: NEGATIVE

## 2017-02-02 LAB — CBC
HEMATOCRIT: 43.4 % (ref 39.0–52.0)
Hemoglobin: 14.5 g/dL (ref 13.0–17.0)
MCH: 30 pg (ref 26.0–34.0)
MCHC: 33.4 g/dL (ref 30.0–36.0)
MCV: 89.9 fL (ref 78.0–100.0)
Platelets: 200 10*3/uL (ref 150–400)
RBC: 4.83 MIL/uL (ref 4.22–5.81)
RDW: 13.1 % (ref 11.5–15.5)
WBC: 7 10*3/uL (ref 4.0–10.5)

## 2017-02-02 LAB — BASIC METABOLIC PANEL
ANION GAP: 8 (ref 5–15)
BUN: 23 mg/dL — AB (ref 6–20)
CHLORIDE: 103 mmol/L (ref 101–111)
CO2: 28 mmol/L (ref 22–32)
Calcium: 9.3 mg/dL (ref 8.9–10.3)
Creatinine, Ser: 0.94 mg/dL (ref 0.61–1.24)
GFR calc Af Amer: >60 mL/min (ref >60)
GFR calc non Af Amer: >60 mL/min (ref >60)
GLUCOSE: 112 mg/dL — AB (ref 65–99)
POTASSIUM: 3.8 mmol/L (ref 3.5–5.1)
Sodium: 139 mmol/L (ref 135–145)

## 2017-02-02 LAB — ABO/RH: ABO/RH(D): B POS

## 2017-02-06 NOTE — H&P (Signed)
TOTAL HIP ADMISSION H&P  Patient is admitted for right total hip arthroplasty, anterior approach.  Subjective:  Chief Complaint:     Right hip primary OA / pain  HPI: William Wang, 61 y.o. male, has a history of pain and functional disability in the right hip(s) due to arthritis and patient has failed non-surgical conservative treatments for greater than 12 weeks to include NSAID's and/or analgesics, corticosteriod injections and activity modification.  Onset of symptoms was gradual starting ~1.5 years ago with gradually worsening course since that time.The patient noted no past surgery on the right hip(s).  Patient currently rates pain in the right hip at 5 out of 10 with activity. Patient has night pain, worsening of pain with activity and weight bearing, trendelenberg gait, pain that interfers with activities of daily living and pain with passive range of motion. Patient has evidence of periarticular osteophytes and joint space narrowing by imaging studies. This condition presents safety issues increasing the risk of falls.   There is no current active infection.   Risks, benefits and expectations were discussed with the patient.  Risks including but not limited to the risk of anesthesia, blood clots, nerve damage, blood vessel damage, failure of the prosthesis, infection and up to and including death.  Patient understand the risks, benefits and expectations and wishes to proceed with surgery.   PCP: Tivis Ringer, MD  D/C Plans:       Home - OPPT  Post-op Meds:       No Rx given   Tranexamic Acid:      To be given - IV    Decadron:      Is to be given  FYI:     ASA  [place]  DME:   Pt already has equipment  PT:   OPPT    Patient Active Problem List   Diagnosis Date Noted  . Lumbar disc disease with radiculopathy 11/11/2015  . HTN (hypertension) 07/13/2011  . SOB (shortness of breath) on exertion    Past Medical History:  Diagnosis Date  . Aortic arch, right    varicose veins   . Colon polyps 01/2002   hyperplastic sgmoid 66mm; removed and tested   . History of kidney stones   . Hyperlipidemia   . Hypertension   . Hypothyroidism     Past Surgical History:  Procedure Laterality Date  . LUMBAR LAMINECTOMY  2002  . LUMBAR MICRODISCECTOMY  2017  . PELVIC FRACTURE SURGERY  11/1967   no surgery involved; healed on its own  . right wrist fracture  sept 2012  . VASECTOMY  1987    No prescriptions prior to admission.   No Known Allergies   Social History  Substance Use Topics  . Smoking status: Never Smoker  . Smokeless tobacco: Never Used  . Alcohol use 8.4 oz/week    14 Glasses of wine per week    Family History  Problem Relation Age of Onset  . Breast cancer Mother   . Hypertension Mother   . Ulcers Mother   . Ulcers Father   . Prostate cancer Father   . Crohn's disease Father   . Prostate cancer Other   . Breast cancer Other   . Pancreatic cancer Other   . Stomach cancer Paternal Grandmother 4     Review of Systems  Constitutional: Negative.   HENT: Negative.   Eyes: Negative.   Respiratory: Negative.   Cardiovascular: Negative.   Gastrointestinal: Negative.   Genitourinary: Negative.   Musculoskeletal:  Positive for joint pain.  Skin: Negative.   Neurological: Negative.   Endo/Heme/Allergies: Negative.   Psychiatric/Behavioral: Negative.     Objective:  Physical Exam  Constitutional: He is oriented to person, place, and time. He appears well-developed.  HENT:  Head: Normocephalic.  Eyes: Pupils are equal, round, and reactive to light.  Neck: Neck supple. No JVD present. No tracheal deviation present. No thyromegaly present.  Cardiovascular: Normal rate, regular rhythm and intact distal pulses.   Respiratory: Effort normal and breath sounds normal. No respiratory distress. He has no wheezes.  GI: Soft. There is no tenderness. There is no guarding.  Musculoskeletal:       Right hip: He exhibits decreased range of motion,  decreased strength, tenderness and bony tenderness. He exhibits no swelling, no deformity and no laceration.  Lymphadenopathy:    He has no cervical adenopathy.  Neurological: He is alert and oriented to person, place, and time.  Skin: Skin is warm and dry.  Psychiatric: He has a normal mood and affect.      Labs:  Estimated body mass index is 26.19 kg/m as calculated from the following:   Height as of 02/02/17: 5\' 11"  (1.803 m).   Weight as of 02/02/17: 85.2 kg (187 lb 12.8 oz).   Imaging Review Plain radiographs demonstrate severe degenerative joint disease of the right hip(s). The bone quality appears to be good for age and reported activity level.  Assessment/Plan:  End stage arthritis, right hip(s)  The patient history, physical examination, clinical judgement of the provider and imaging studies are consistent with end stage degenerative joint disease of the right hip(s) and total hip arthroplasty is deemed medically necessary. The treatment options including medical management, injection therapy, arthroscopy and arthroplasty were discussed at length. The risks and benefits of total hip arthroplasty were presented and reviewed. The risks due to aseptic loosening, infection, stiffness, dislocation/subluxation,  thromboembolic complications and other imponderables were discussed.  The patient acknowledged the explanation, agreed to proceed with the plan and consent was signed. Patient is being admitted for inpatient treatment for surgery, pain control, PT, OT, prophylactic antibiotics, VTE prophylaxis, progressive ambulation and ADL's and discharge planning.The patient is planning to be discharged home.       [place] Pugh Shaheen Star   PA-C  02/06/2017, 2:11 PM

## 2017-02-07 NOTE — Anesthesia Preprocedure Evaluation (Signed)
Anesthesia Evaluation  Patient identified by MRN, date of birth, ID band Patient awake    Reviewed: Allergy & Precautions, H&P , Patient's Chart, lab work & pertinent test results  Airway Mallampati: II  TM Distance: >3 FB Neck ROM: full    Dental no notable dental hx.    Pulmonary    Pulmonary exam normal breath sounds clear to auscultation       Cardiovascular Exercise Tolerance: Good hypertension,  Rhythm:regular Rate:Normal     Neuro/Psych    GI/Hepatic   Endo/Other    Renal/GU      Musculoskeletal   Abdominal   Peds  Hematology   Anesthesia Other Findings   Reproductive/Obstetrics                             Anesthesia Physical Anesthesia Plan  ASA: II  Anesthesia Plan: Spinal   Post-op Pain Management:    Induction:   Airway Management Planned:   Additional Equipment:   Intra-op Plan:   Post-operative Plan:   Informed Consent: I have reviewed the patients History and Physical, chart, labs and discussed the procedure including the risks, benefits and alternatives for the proposed anesthesia with the patient or authorized representative who has indicated his/her understanding and acceptance.     Plan Discussed with:   Anesthesia Plan Comments: (  )        Anesthesia Quick Evaluation

## 2017-02-08 ENCOUNTER — Encounter (HOSPITAL_COMMUNITY): Payer: Self-pay | Admitting: *Deleted

## 2017-02-08 ENCOUNTER — Inpatient Hospital Stay (HOSPITAL_COMMUNITY): Payer: PRIVATE HEALTH INSURANCE | Admitting: Anesthesiology

## 2017-02-08 ENCOUNTER — Inpatient Hospital Stay (HOSPITAL_COMMUNITY): Payer: PRIVATE HEALTH INSURANCE

## 2017-02-08 ENCOUNTER — Inpatient Hospital Stay (HOSPITAL_COMMUNITY)
Admission: RE | Admit: 2017-02-08 | Discharge: 2017-02-09 | DRG: 470 | Disposition: A | Discharge status: Home / Self Care | Payer: PRIVATE HEALTH INSURANCE | Source: Ambulatory Visit | Attending: Orthopedic Surgery | Admitting: Orthopedic Surgery

## 2017-02-08 ENCOUNTER — Encounter (HOSPITAL_COMMUNITY)
Admission: RE | Disposition: A | Discharge status: Home / Self Care | Payer: Self-pay | Source: Ambulatory Visit | Attending: Orthopedic Surgery

## 2017-02-08 DIAGNOSIS — I10 Essential (primary) hypertension: Secondary | ICD-10-CM | POA: Diagnosis present

## 2017-02-08 DIAGNOSIS — M1611 Unilateral primary osteoarthritis, right hip: Principal | ICD-10-CM | POA: Diagnosis present

## 2017-02-08 DIAGNOSIS — Z96641 Presence of right artificial hip joint: Secondary | ICD-10-CM

## 2017-02-08 DIAGNOSIS — I8390 Asymptomatic varicose veins of unspecified lower extremity: Secondary | ICD-10-CM | POA: Diagnosis present

## 2017-02-08 DIAGNOSIS — E785 Hyperlipidemia, unspecified: Secondary | ICD-10-CM | POA: Diagnosis present

## 2017-02-08 DIAGNOSIS — M5116 Intervertebral disc disorders with radiculopathy, lumbar region: Secondary | ICD-10-CM | POA: Diagnosis present

## 2017-02-08 DIAGNOSIS — Z96649 Presence of unspecified artificial hip joint: Secondary | ICD-10-CM

## 2017-02-08 DIAGNOSIS — E039 Hypothyroidism, unspecified: Secondary | ICD-10-CM | POA: Diagnosis present

## 2017-02-08 HISTORY — PX: TOTAL HIP ARTHROPLASTY: SHX124

## 2017-02-08 LAB — TYPE AND SCREEN
ABO/RH(D): B POS
Antibody Screen: NEGATIVE

## 2017-02-08 SURGERY — ARTHROPLASTY, HIP, TOTAL, ANTERIOR APPROACH
Anesthesia: Spinal | Site: Hip | Laterality: Right

## 2017-02-08 MED ORDER — DEXAMETHASONE SODIUM PHOSPHATE 10 MG/ML IJ SOLN
10.0000 mg | Freq: Once | INTRAMUSCULAR | Status: AC
  Administered 2017-02-09: 08:00:00 10 mg via INTRAVENOUS
  Filled 2017-02-08: qty 1

## 2017-02-08 MED ORDER — CHLORHEXIDINE GLUCONATE 4 % EX LIQD
60.0000 mL | Freq: Once | CUTANEOUS | Status: DC
Start: 2017-02-08 — End: 2017-02-08

## 2017-02-08 MED ORDER — METOCLOPRAMIDE HCL 5 MG/ML IJ SOLN
5.0000 mg | Freq: Three times a day (TID) | INTRAMUSCULAR | Status: DC | PRN
  Administered 2017-02-08: 10 mg via INTRAVENOUS
  Filled 2017-02-08: qty 2

## 2017-02-08 MED ORDER — MENTHOL 3 MG MT LOZG: 1.0000 | LOZENGE | OROMUCOSAL | Status: DC | PRN

## 2017-02-08 MED ORDER — METHOCARBAMOL 500 MG PO TABS
500.0000 mg | ORAL_TABLET | Freq: Four times a day (QID) | ORAL | Status: DC | PRN
  Administered 2017-02-08 – 2017-02-09 (×2): 500 mg via ORAL
  Filled 2017-02-08 (×2): qty 1

## 2017-02-08 MED ORDER — HYDROCODONE-ACETAMINOPHEN 7.5-325 MG PO TABS: 1.0000 | ORAL_TABLET | ORAL | 0 refills | Status: DC | PRN

## 2017-02-08 MED ORDER — FENTANYL CITRATE (PF) 100 MCG/2ML IJ SOLN
INTRAMUSCULAR | Status: AC
  Filled 2017-02-08: qty 2

## 2017-02-08 MED ORDER — MIDAZOLAM HCL 2 MG/2ML IJ SOLN
INTRAMUSCULAR | Status: AC
  Filled 2017-02-08: qty 2

## 2017-02-08 MED ORDER — ONDANSETRON HCL 4 MG/2ML IJ SOLN: 4.0000 mg | Freq: Four times a day (QID) | INTRAMUSCULAR | Status: DC | PRN

## 2017-02-08 MED ORDER — ONDANSETRON HCL 4 MG/2ML IJ SOLN
INTRAMUSCULAR | Status: DC | PRN
  Administered 2017-02-08: 4 mg via INTRAVENOUS

## 2017-02-08 MED ORDER — POLYETHYLENE GLYCOL 3350 17 G PO PACK
17.0000 g | PACK | Freq: Two times a day (BID) | ORAL | Status: DC
  Administered 2017-02-09: 17 g via ORAL
  Filled 2017-02-08: qty 1

## 2017-02-08 MED ORDER — LIDOCAINE 2% (20 MG/ML) 5 ML SYRINGE
INTRAMUSCULAR | Status: AC
  Filled 2017-02-08: qty 5

## 2017-02-08 MED ORDER — LACTATED RINGERS IV SOLN
INTRAVENOUS | Status: DC
  Administered 2017-02-08 (×2): via INTRAVENOUS

## 2017-02-08 MED ORDER — METHOCARBAMOL 500 MG PO TABS: 500.0000 mg | ORAL_TABLET | Freq: Four times a day (QID) | ORAL | 0 refills | Status: DC | PRN

## 2017-02-08 MED ORDER — SODIUM CHLORIDE 0.9 % IR SOLN
Status: DC | PRN
  Administered 2017-02-08: 1000 mL

## 2017-02-08 MED ORDER — CHLORTHALIDONE 25 MG PO TABS
25.0000 mg | ORAL_TABLET | ORAL | Status: DC
  Administered 2017-02-09: 25 mg via ORAL
  Filled 2017-02-08: qty 1

## 2017-02-08 MED ORDER — CEFAZOLIN SODIUM-DEXTROSE 2-4 GM/100ML-% IV SOLN
INTRAVENOUS | Status: AC
  Filled 2017-02-08: qty 100

## 2017-02-08 MED ORDER — HYDROMORPHONE HCL 1 MG/ML IJ SOLN
0.5000 mg | INTRAMUSCULAR | Status: DC | PRN
  Administered 2017-02-08: 0.5 mg via INTRAVENOUS
  Filled 2017-02-08: qty 0.5

## 2017-02-08 MED ORDER — HYDROMORPHONE HCL 1 MG/ML IJ SOLN: 0.2500 mg | INTRAMUSCULAR | Status: DC | PRN

## 2017-02-08 MED ORDER — ASPIRIN 81 MG PO CHEW: 81.0000 mg | CHEWABLE_TABLET | Freq: Two times a day (BID) | ORAL | 0 refills | Status: AC

## 2017-02-08 MED ORDER — DIPHENHYDRAMINE HCL 25 MG PO CAPS: 25.0000 mg | ORAL_CAPSULE | Freq: Four times a day (QID) | ORAL | Status: DC | PRN

## 2017-02-08 MED ORDER — FERROUS SULFATE 325 (65 FE) MG PO TABS
325.0000 mg | ORAL_TABLET | Freq: Three times a day (TID) | ORAL | Status: DC
  Administered 2017-02-08 – 2017-02-09 (×2): 325 mg via ORAL
  Filled 2017-02-08 (×3): qty 1

## 2017-02-08 MED ORDER — MAGNESIUM CITRATE PO SOLN
1.0000 | Freq: Once | ORAL | Status: DC | PRN
Start: 2017-02-08 — End: 2017-02-09

## 2017-02-08 MED ORDER — FERROUS SULFATE 325 (65 FE) MG PO TABS: 325.0000 mg | ORAL_TABLET | Freq: Three times a day (TID) | ORAL | Status: DC

## 2017-02-08 MED ORDER — BUPIVACAINE IN DEXTROSE 0.75-8.25 % IT SOLN
INTRATHECAL | Status: DC | PRN
  Administered 2017-02-08: 1.9 mL via INTRATHECAL

## 2017-02-08 MED ORDER — ROSUVASTATIN CALCIUM 10 MG PO TABS
10.0000 mg | ORAL_TABLET | Freq: Every day | ORAL | Status: DC
  Administered 2017-02-08: 10 mg via ORAL
  Filled 2017-02-08: qty 1

## 2017-02-08 MED ORDER — CELECOXIB 200 MG PO CAPS
200.0000 mg | ORAL_CAPSULE | Freq: Two times a day (BID) | ORAL | Status: DC
  Administered 2017-02-08 – 2017-02-09 (×3): 200 mg via ORAL
  Filled 2017-02-08 (×3): qty 1

## 2017-02-08 MED ORDER — METOCLOPRAMIDE HCL 5 MG PO TABS
5.0000 mg | ORAL_TABLET | Freq: Three times a day (TID) | ORAL | Status: DC | PRN
Start: 2017-02-08 — End: 2017-02-09

## 2017-02-08 MED ORDER — PROPOFOL 10 MG/ML IV BOLUS
INTRAVENOUS | Status: AC
  Filled 2017-02-08: qty 60

## 2017-02-08 MED ORDER — CEFAZOLIN SODIUM-DEXTROSE 2-4 GM/100ML-% IV SOLN
2.0000 g | Freq: Four times a day (QID) | INTRAVENOUS | Status: AC
  Administered 2017-02-08 (×2): 2 g via INTRAVENOUS
  Filled 2017-02-08 (×2): qty 100

## 2017-02-08 MED ORDER — FENTANYL CITRATE (PF) 100 MCG/2ML IJ SOLN
INTRAMUSCULAR | Status: DC | PRN
  Administered 2017-02-08: 100 ug via INTRAVENOUS

## 2017-02-08 MED ORDER — ONDANSETRON HCL 4 MG PO TABS: 4.0000 mg | ORAL_TABLET | Freq: Four times a day (QID) | ORAL | Status: DC | PRN

## 2017-02-08 MED ORDER — ALUM & MAG HYDROXIDE-SIMETH 200-200-20 MG/5ML PO SUSP: 20.0000 mL | ORAL | Status: DC | PRN

## 2017-02-08 MED ORDER — BISACODYL 10 MG RE SUPP: 10.0000 mg | Freq: Every day | RECTAL | Status: DC | PRN

## 2017-02-08 MED ORDER — HYDROCODONE-ACETAMINOPHEN 7.5-325 MG PO TABS
1.0000 | ORAL_TABLET | ORAL | Status: DC
  Administered 2017-02-08: 1 via ORAL
  Administered 2017-02-08: 2 via ORAL
  Administered 2017-02-08 – 2017-02-09 (×3): 1 via ORAL
  Filled 2017-02-08: qty 1
  Filled 2017-02-08 (×2): qty 2
  Filled 2017-02-08: qty 1
  Filled 2017-02-08: qty 2

## 2017-02-08 MED ORDER — SODIUM CHLORIDE 0.9 % IV SOLN
1000.0000 mg | INTRAVENOUS | Status: AC
  Administered 2017-02-08: 1000 mg via INTRAVENOUS
  Filled 2017-02-08: qty 1100

## 2017-02-08 MED ORDER — PROPOFOL 500 MG/50ML IV EMUL
INTRAVENOUS | Status: DC | PRN
  Administered 2017-02-08: 140 ug/kg/min via INTRAVENOUS

## 2017-02-08 MED ORDER — MIDAZOLAM HCL 5 MG/5ML IJ SOLN
INTRAMUSCULAR | Status: DC | PRN
  Administered 2017-02-08: 2 mg via INTRAVENOUS

## 2017-02-08 MED ORDER — ASPIRIN 81 MG PO CHEW
81.0000 mg | CHEWABLE_TABLET | Freq: Two times a day (BID) | ORAL | Status: DC
  Administered 2017-02-08 – 2017-02-09 (×2): 81 mg via ORAL
  Filled 2017-02-08 (×2): qty 1

## 2017-02-08 MED ORDER — DOCUSATE SODIUM 100 MG PO CAPS
100.0000 mg | ORAL_CAPSULE | Freq: Two times a day (BID) | ORAL | Status: DC
  Administered 2017-02-08 – 2017-02-09 (×2): 100 mg via ORAL
  Filled 2017-02-08 (×2): qty 1

## 2017-02-08 MED ORDER — METHOCARBAMOL 1000 MG/10ML IJ SOLN
500.0000 mg | Freq: Four times a day (QID) | INTRAVENOUS | Status: DC | PRN
  Administered 2017-02-08: 11:00:00 500 mg via INTRAVENOUS
  Filled 2017-02-08: qty 5
  Filled 2017-02-08: qty 550

## 2017-02-08 MED ORDER — DEXAMETHASONE SODIUM PHOSPHATE 10 MG/ML IJ SOLN
10.0000 mg | Freq: Once | INTRAMUSCULAR | Status: AC
  Administered 2017-02-08: 10 mg via INTRAVENOUS

## 2017-02-08 MED ORDER — PHENOL 1.4 % MT LIQD: 1.0000 | OROMUCOSAL | Status: DC | PRN

## 2017-02-08 MED ORDER — CEFAZOLIN SODIUM-DEXTROSE 2-4 GM/100ML-% IV SOLN
2.0000 g | INTRAVENOUS | Status: AC
  Administered 2017-02-08: 2 g via INTRAVENOUS

## 2017-02-08 MED ORDER — DEXAMETHASONE SODIUM PHOSPHATE 10 MG/ML IJ SOLN
INTRAMUSCULAR | Status: AC
Start: 2017-02-08 — End: 2017-02-08
  Filled 2017-02-08: qty 1

## 2017-02-08 MED ORDER — DOCUSATE SODIUM 100 MG PO CAPS: 100.0000 mg | ORAL_CAPSULE | Freq: Two times a day (BID) | ORAL | 0 refills | Status: DC

## 2017-02-08 MED ORDER — SUCCINYLCHOLINE CHLORIDE 200 MG/10ML IV SOSY
PREFILLED_SYRINGE | INTRAVENOUS | Status: AC
  Filled 2017-02-08: qty 10

## 2017-02-08 MED ORDER — POLYETHYLENE GLYCOL 3350 17 G PO PACK: 17.0000 g | PACK | Freq: Two times a day (BID) | ORAL | 0 refills | Status: DC

## 2017-02-08 MED ORDER — ONDANSETRON HCL 4 MG/2ML IJ SOLN
INTRAMUSCULAR | Status: AC
  Filled 2017-02-08: qty 2

## 2017-02-08 MED ORDER — POTASSIUM CHLORIDE 2 MEQ/ML IV SOLN
100.0000 mL/h | INTRAVENOUS | Status: DC
  Administered 2017-02-08: 23:00:00 100 mL/h via INTRAVENOUS
  Filled 2017-02-08 (×3): qty 1000

## 2017-02-08 SURGICAL SUPPLY — 37 items
BAG DECANTER FOR FLEXI CONT (MISCELLANEOUS) IMPLANT
BAG ZIPLOCK 12X15 (MISCELLANEOUS) IMPLANT
BLADE SAG 18X100X1.27 (BLADE) ×3 IMPLANT
CAPT HIP TOTAL 2 ×3 IMPLANT
CLOTH BEACON ORANGE TIMEOUT ST (SAFETY) ×3 IMPLANT
COVER PERINEAL POST (MISCELLANEOUS) ×3 IMPLANT
DERMABOND ADVANCED (GAUZE/BANDAGES/DRESSINGS) ×2
DERMABOND ADVANCED .7 DNX12 (GAUZE/BANDAGES/DRESSINGS) ×1 IMPLANT
DRAPE STERI IOBAN 125X83 (DRAPES) ×3 IMPLANT
DRAPE U-SHAPE 47X51 STRL (DRAPES) ×6 IMPLANT
DRESSING AQUACEL AG SP 3.5X10 (GAUZE/BANDAGES/DRESSINGS) ×1 IMPLANT
DRSG AQUACEL AG SP 3.5X10 (GAUZE/BANDAGES/DRESSINGS) ×3
DURAPREP 26ML APPLICATOR (WOUND CARE) ×3 IMPLANT
ELECT REM PT RETURN 15FT ADLT (MISCELLANEOUS) ×3 IMPLANT
GLOVE BIOGEL M STRL SZ7.5 (GLOVE) IMPLANT
GLOVE BIOGEL PI IND STRL 7.5 (GLOVE) ×1 IMPLANT
GLOVE BIOGEL PI IND STRL 8.5 (GLOVE) ×1 IMPLANT
GLOVE BIOGEL PI INDICATOR 7.5 (GLOVE) ×2
GLOVE BIOGEL PI INDICATOR 8.5 (GLOVE) ×2
GLOVE ECLIPSE 8.0 STRL XLNG CF (GLOVE) ×6 IMPLANT
GLOVE ORTHO TXT STRL SZ7.5 (GLOVE) ×3 IMPLANT
GOWN STRL REUS W/TWL LRG LVL3 (GOWN DISPOSABLE) ×3 IMPLANT
GOWN STRL REUS W/TWL XL LVL3 (GOWN DISPOSABLE) ×3 IMPLANT
HOLDER FOLEY CATH W/STRAP (MISCELLANEOUS) ×3 IMPLANT
LIQUID BAND (GAUZE/BANDAGES/DRESSINGS) ×3 IMPLANT
PACK ANTERIOR HIP CUSTOM (KITS) ×3 IMPLANT
SUT MNCRL AB 4-0 PS2 18 (SUTURE) ×3 IMPLANT
SUT STRATAFIX 0 PDS 27 VIOLET (SUTURE) ×3
SUT VIC AB 1 CT1 36 (SUTURE) ×9 IMPLANT
SUT VIC AB 2-0 CT1 27 (SUTURE) ×4
SUT VIC AB 2-0 CT1 TAPERPNT 27 (SUTURE) ×2 IMPLANT
SUT VLOC 180 0 24IN GS25 (SUTURE) ×3 IMPLANT
SUTURE STRATFX 0 PDS 27 VIOLET (SUTURE) ×1 IMPLANT
TRAY FOLEY W/METER SILVER 16FR (SET/KITS/TRAYS/PACK) IMPLANT
WATER STERILE IRR 1000ML POUR (IV SOLUTION) ×6 IMPLANT
WATER STERILE IRR 1500ML POUR (IV SOLUTION) IMPLANT
YANKAUER SUCT BULB TIP 10FT TU (MISCELLANEOUS) IMPLANT

## 2017-02-08 NOTE — Interval H&P Note (Signed)
History and Physical Interval Note:  02/08/2017 3:96 AM  William Wang  has presented today for surgery, with the diagnosis of RIGHT hip osteoarthritis  The various methods of treatment have been discussed with the patient and family. After consideration of risks, benefits and other options for treatment, the patient has consented to  Procedure(s) with comments: RIGHT TOTAL HIP ARTHROPLASTY ANTERIOR APPROACH (Right) - requests 70 mins as a surgical intervention .  The patient's history has been reviewed, patient examined, no change in status, stable for surgery.  I have reviewed the patient's chart and labs.  Questions were answered to the patient's satisfaction.     Mauri Pole

## 2017-02-08 NOTE — Anesthesia Postprocedure Evaluation (Signed)
Anesthesia Post Note  Patient: William Wang  Procedure(s) Performed: Procedure(s) (LRB): RIGHT TOTAL HIP ARTHROPLASTY ANTERIOR APPROACH (Right)  Patient location during evaluation: PACU Anesthesia Type: Spinal Level of consciousness: awake Pain management: satisfactory to patient Vital Signs Assessment: post-procedure vital signs reviewed and stable Respiratory status: spontaneous breathing Cardiovascular status: blood pressure returned to baseline Postop Assessment: no headache and spinal receding Anesthetic complications: no       Last Vitals:  Vitals:   02/08/17 1027 02/08/17 1128  BP: 129/82 114/76  Pulse: (!) 46 (!) 50  Resp: 12 16  Temp: 36.7 C 36.4 C    Last Pain:  Vitals:   02/08/17 1146  TempSrc:   PainSc: [place]

## 2017-02-08 NOTE — Progress Notes (Signed)
X-ray results noted 

## 2017-02-08 NOTE — Op Note (Signed)
NAME:  William Wang                ACCOUNT NO.: [id number]      MEDICAL RECORD NO.: [id number]      FACILITY:  [facility]      PHYSICIAN:  Paralee Cancel D  DATE OF BIRTH:  [birth year]     DATE OF PROCEDURE:  02/08/2017                                 OPERATIVE REPORT         PREOPERATIVE DIAGNOSIS: Right  hip osteoarthritis.      POSTOPERATIVE DIAGNOSIS:  Right hip osteoarthritis.      PROCEDURE:  Right total hip replacement through an anterior approach   utilizing DePuy THR system, component size 28mm pinnacle cup, a size 36+4 neutral   Altrex liner, a size 4 Hi Tri Lock stem with a 36+1.5 delta ceramic   ball.      SURGEON:  Pietro Cassis. Alvan Dame, M.D.      ASSISTANT:  Danae Orleans, PA-C     ANESTHESIA:  Spinal.      SPECIMENS:  None.      COMPLICATIONS:  None.      BLOOD LOSS:  400 cc     DRAINS:  none.      INDICATION OF THE PROCEDURE:  William Wang is a 61 y.o. male who had   presented to office for evaluation of right hip pain.  Radiographs revealed   progressive degenerative changes with bone-on-bone   articulation to the  hip joint.  The patient had painful limited range of   motion significantly affecting their overall quality of life.  The patient was failing to    respond to conservative measures, and at this point was ready   to proceed with more definitive measures.  The patient has noted progressive   degenerative changes in his hip, progressive problems and dysfunction   with regarding the hip prior to surgery.  Consent was obtained for   benefit of pain relief.  Specific risk of infection, DVT, component   failure, dislocation, need for revision surgery, as well discussion of   the anterior versus posterior approach were reviewed.  Consent was   obtained for benefit of anterior pain relief through an anterior   approach.      PROCEDURE IN DETAIL:  The patient was brought to operative theater.   Once adequate anesthesia, preoperative  antibiotics, 2gm of Ancef, 1 gm of Tranexamic Acid, and 10 mg of Decadron administered.   The patient was positioned supine on the OSI Hanna table.  Once adequate   padding of boney process was carried out, we had predraped out the hip, and  used fluoroscopy to confirm orientation of the pelvis and position.      The right hip was then prepped and draped from proximal iliac crest to   mid thigh with shower curtain technique.      Time-out was performed identifying the patient, planned procedure, and   extremity.     An incision was then made 2 cm distal and lateral to the   anterior superior iliac spine extending over the orientation of the   tensor fascia lata muscle and sharp dissection was carried down to the   fascia of the muscle and protractor placed in the soft tissues.      The fascia was then incised.  The muscle belly was identified and swept   laterally and retractor placed along the superior neck.  Following   cauterization of the circumflex vessels and removing some pericapsular   fat, a second cobra retractor was placed on the inferior neck.  A third   retractor was placed on the anterior acetabulum after elevating the   anterior rectus.  A L-capsulotomy was along the line of the   superior neck to the trochanteric fossa, then extended proximally and   distally.  Tag sutures were placed and the retractors were then placed   intracapsular.  We then identified the trochanteric fossa and   orientation of my neck cut, confirmed this radiographically   and then made a neck osteotomy with the femur on traction.  The femoral   head was removed without difficulty or complication.  Traction was let   off and retractors were placed posterior and anterior around the   acetabulum.      The labrum and foveal tissue were debrided.  I began reaming with a 14mm   reamer and reamed up to 49mm reamer with good bony bed preparation and a 21mm   cup was chosen.  The final 24mm Pinnacle cup  was then impacted under fluoroscopy  to confirm the depth of penetration and orientation with respect to   abduction.  A screw was placed followed by the hole eliminator.  The final   36+4 neutral Altrex liner was impacted with good visualized rim fit.  The cup was positioned anatomically within the acetabular portion of the pelvis.      At this point, the femur was rolled at 80 degrees.  Further capsule was   released off the inferior aspect of the femoral neck.  I then   released the superior capsule proximally.  The hook was placed laterally   along the femur and elevated manually and held in position with the bed   hook.  The leg was then extended and adducted with the leg rolled to 100   degrees of external rotation.  Once the proximal femur was fully   exposed, I used a box osteotome to set orientation.  I then began   broaching with the starting chili pepper broach and passed this by hand and then broached up to 4.  With the 4 broach in place I chose a high offset neck and did trial reductions.  The offset was appropriate, leg lengths   appeared to be equal best matched with the +1.5 head ball confirmed radiographically.   Given these findings, I went ahead and dislocated the hip, repositioned all   retractors and positioned the right hip in the extended and abducted position.  The final 4 Hi Tri Lock stem was   chosen and it was impacted down to the level of neck cut.  Based on this   and the trial reduction, a 36+1.5 delta ceramic ball was chosen and   impacted onto a clean and dry trunnion, and the hip was reduced.  The   hip had been irrigated throughout the case again at this point.  I did   reapproximate the superior capsular leaflet to the anterior leaflet   using #1 Vicryl.  The fascia of the   tensor fascia lata muscle was then reapproximated using #1 Vicryl and #0 Stratafix sutures.  The   remaining wound was closed with 2-0 Vicryl and running 4-0 Monocryl.   The hip was  cleaned, dried, and dressed sterilely using Dermabond  and   Aquacel dressing.  He was then brought   to recovery room in stable condition tolerating the procedure well.    Danae Orleans, PA-C was present for the entirety of the case involved from   preoperative positioning, perioperative retractor management, general   facilitation of the case, as well as primary wound closure as assistant.            Pietro Cassis Alvan Dame, M.D.        02/08/2017 8:59 AM

## 2017-02-08 NOTE — Anesthesia Procedure Notes (Signed)
Spinal  Patient location during procedure: OR Preanesthetic Checklist Completed: patient identified, site marked, surgical consent, pre-op evaluation, timeout performed, IV checked, risks and benefits discussed and monitors and equipment checked Spinal Block Patient position: sitting Prep: DuraPrep Patient monitoring: heart rate, cardiac monitor, continuous pulse ox and blood pressure Approach: midline Location: L3-4 Injection technique: single-shot Needle Needle type: Sprotte  Needle gauge: 24 G Needle length: 9 cm Assessment Sensory level: T4 Additional Notes Spinal Dosage in OR  Bupivicaine ml       1.9 RLD x 3 min

## 2017-02-08 NOTE — Transfer of Care (Signed)
Immediate Anesthesia Transfer of Care Note  Patient: William Wang  Procedure(s) Performed: Procedure(s) with comments: RIGHT TOTAL HIP ARTHROPLASTY ANTERIOR APPROACH (Right) - requests 70 mins  Patient Location: PACU  Anesthesia Type:Spinal  Level of Consciousness: awake  Airway & Oxygen Therapy: Patient Spontanous Breathing and Patient connected to face mask oxygen  Post-op Assessment: Report given to RN and Post -op Vital signs reviewed and stable  Post vital signs: Reviewed and stable  Last Vitals:  Vitals:   02/08/17 0525  BP: (!) 145/95  Pulse: 65  Resp: 16  Temp: 36.5 C    Last Pain:  Vitals:   02/08/17 0525  TempSrc: Oral         Complications: No apparent anesthesia complications

## 2017-02-08 NOTE — Progress Notes (Signed)
Portable AP Pelvis and Lateral Right Hip X-rays done. 

## 2017-02-08 NOTE — Discharge Instructions (Signed)

## 2017-02-08 NOTE — Evaluation (Signed)
Physical Therapy Evaluation Patient Details Name: William Wang MRN: [id number] DOB: April 26, 1956 Today's Date: 02/08/2017   History of Present Illness   William Wang  Clinical Impression  The patient is mobilizing very well. Ambulated x 400'. The patient would like to use crutches and not get a RW. He actually took steps without hansds on Rw. Pt admitted with above diagnosis. Pt currently with functional limitations due to the deficits listed below (see PT Problem List). Pt will benefit from skilled PT to increase their independence and safety with mobility to allow discharge to the venue listed below.       Follow Up Recommendations Outpatient PT (plans OPPT next week.)    Equipment Recommendations  Rolling walker with 5" wheels (wants to not get a RW.)    Recommendations for Other Services       Precautions / Restrictions Precautions Precautions: Fall      Mobility  Bed Mobility Overal bed mobility: Needs Assistance Bed Mobility: Supine to Sit     Supine to sit: Min assist     General bed mobility comments: cues for technique , assist with right leg.  Transfers Overall transfer level: Needs assistance Equipment used: Rolling walker (2 wheeled) Transfers: Sit to/from Stand Sit to Stand: Min assist         General transfer comment: cues for hand and right leg position  Ambulation/Gait Ambulation/Gait assistance: Min assist Ambulation Distance (Feet): 400 Feet Assistive device: Rolling walker (2 wheeled) Gait Pattern/deviations: Step-to pattern;Step-through pattern     General Gait Details: cues for sequence  Stairs            Wheelchair Mobility    Modified Rankin (Stroke Patients Only)       Balance                                             Pertinent Vitals/Pain Pain Assessment: 0-10 Pain Score: 3  Pain Location: right hip and thigh Pain Descriptors / Indicators: Discomfort;Tightness Pain Intervention(s): Limited activity  within patient's tolerance;Monitored during session;Ice applied;Repositioned    Home Living Family/patient expects to be discharged to:: Private residence Living Arrangements: Spouse/significant other Available Help at Discharge: Family Type of Home: House Home Access: Stairs to enter Entrance Stairs-Rails: None Technical brewer of Steps: 3 Home Layout: Two level Home Equipment: Crutches;Cane - single point Additional Comments: 2 hiking sticks    Prior Function Level of Independence: Independent               Hand Dominance        Extremity/Trunk Assessment   Upper Extremity Assessment Upper Extremity Assessment: Defer to OT evaluation    Lower Extremity Assessment Lower Extremity Assessment: RLE deficits/detail RLE Deficits / Details: able to advance the leg during swing.    Cervical / Trunk Assessment Cervical / Trunk Assessment: Normal  Communication   Communication: No difficulties  Cognition Arousal/Alertness: Awake/alert Behavior During Therapy: WFL for tasks assessed/performed Overall Cognitive Status: Within Functional Limits for tasks assessed                                        General Comments      Exercises     Assessment/Plan    PT Assessment Patient needs continued PT services  PT Problem List Decreased  strength;Decreased range of motion;Decreased activity tolerance;Pain;Decreased knowledge of use of DME       PT Treatment Interventions DME instruction;Gait training;Stair training;Functional mobility training;Therapeutic activities;Therapeutic exercise;Patient/family education    PT Goals (Current goals can be found in the Care Plan section)  Acute Rehab PT Goals Patient Stated Goal: to go home, play hockey PT Goal Formulation: With patient/family Time For Goal Achievement: 02/10/17 Potential to Achieve Goals: Good    Frequency 7X/week   Barriers to discharge        Co-evaluation                End of Session   Activity Tolerance: Patient tolerated treatment well Patient left: in chair;with call bell/phone within reach;with family/visitor present Nurse Communication: Mobility status PT Visit Diagnosis: Difficulty in walking, not elsewhere classified (R26.2)    Time: 5053-9767 PT Time Calculation (min) (ACUTE ONLY): 27 min   Charges:   PT Evaluation $PT Eval Low Complexity: 1 Procedure PT Treatments $Gait Training: 8-22 mins   PT G CodesTresa Wang PT [phone]   William Wang 02/08/2017, 6:51 PM

## 2017-02-09 LAB — CBC
HCT: 35.8 % — ABNORMAL LOW (ref 39.0–52.0)
HEMOGLOBIN: 12.4 g/dL — AB (ref 13.0–17.0)
MCH: 30.6 pg (ref 26.0–34.0)
MCHC: 34.6 g/dL (ref 30.0–36.0)
MCV: 88.4 fL (ref 78.0–100.0)
Platelets: 178 10*3/uL (ref 150–400)
RBC: 4.05 MIL/uL — ABNORMAL LOW (ref 4.22–5.81)
RDW: 13.2 % (ref 11.5–15.5)
WBC: 11.8 10*3/uL — ABNORMAL HIGH (ref 4.0–10.5)

## 2017-02-09 LAB — BASIC METABOLIC PANEL
Anion gap: 6 (ref 5–15)
BUN: 17 mg/dL (ref 6–20)
CHLORIDE: 103 mmol/L (ref 101–111)
CO2: 27 mmol/L (ref 22–32)
CREATININE: 1.01 mg/dL (ref 0.61–1.24)
Calcium: 8.6 mg/dL — ABNORMAL LOW (ref 8.9–10.3)
GFR calc Af Amer: >60 mL/min (ref >60)
GFR calc non Af Amer: >60 mL/min (ref >60)
GLUCOSE: 134 mg/dL — AB (ref 65–99)
Potassium: 3.6 mmol/L (ref 3.5–5.1)
Sodium: 136 mmol/L (ref 135–145)

## 2017-02-09 MED ORDER — CELECOXIB 200 MG PO CAPS: 200.0000 mg | ORAL_CAPSULE | Freq: Two times a day (BID) | ORAL | 0 refills | Status: DC

## 2017-02-09 NOTE — Progress Notes (Signed)
     Subjective: 1 Day Post-Op Procedure(s) (LRB): RIGHT TOTAL HIP ARTHROPLASTY ANTERIOR APPROACH (Right)   Patient reports pain as mild, some discomfort initially which has improved.  No events throughout the night.  Ready to be discharged home.   Objective:   VITALS:   Vitals:   02/09/17 0117 02/09/17 0500  BP: 110/71 120/73  Pulse: 66 64  Resp: 17 17  Temp: 98.4 F (36.9 C) 98.6 F (37 C)    Dorsiflexion/Plantar flexion intact Incision: dressing C/D/I No cellulitis present Compartment soft  LABS  Recent Labs  02/09/17 0418  HGB 12.4*  HCT 35.8*  WBC 11.8*  PLT 178     Recent Labs  02/09/17 0418  NA 136  K 3.6  BUN 17  CREATININE 1.01  GLUCOSE 134*     Assessment/Plan: 1 Day Post-Op Procedure(s) (LRB): RIGHT TOTAL HIP ARTHROPLASTY ANTERIOR APPROACH (Right)  Foley cath d/c'ed Advance diet Up with therapy D/C IV fluids Discharge home Follow up in 2 weeks at [facility]. Follow up with OLIN,Leng Montesdeoca D in 2 weeks.  Contact information:  [facility] [address] [place] Yulian Gosney   PAC  02/09/2017, 8:35 AM

## 2017-02-09 NOTE — Progress Notes (Signed)
qPhysical Therapy Treatment Patient Details Name: William Wang MRN: [id number] DOB: 24-Apr-1956 Today's Date: 02/09/2017    History of Present Illness  R DATHA    PT Comments    Pt making excellent progress; ready for D/C from PT standpoint   Follow Up Recommendations  Outpatient PT     Equipment Recommendations  None recommended by PT    Recommendations for Other Services       Precautions / Restrictions Precautions Precautions: Fall Precaution Comments: discussed avoiding hyperextension R hip Restrictions Weight Bearing Restrictions: No Other Position/Activity Restrictions: WBAT    Mobility  Bed Mobility Overal bed mobility: Needs Assistance Bed Mobility: Supine to Sit     Supine to sit: Supervision     General bed mobility comments: for safety, slightly incr time  Transfers Overall transfer level: Needs assistance Equipment used: Rolling walker (2 wheeled) Transfers: Sit to/from Stand Sit to Stand: Supervision         General transfer comment: cues for hand and right leg position  Ambulation/Gait Ambulation/Gait assistance: Supervision Ambulation Distance (Feet): 500 Feet Assistive device: Rolling walker (2 wheeled);Crutches Gait Pattern/deviations: Step-to pattern;Step-through pattern     General Gait Details: cues for progression and progression to 1 crutch    Stairs Stairs: Yes   Stair Management: No rails;Forwards;With crutches;Step to pattern Number of Stairs: 4 General stair comments: cues for sequence  Wheelchair Mobility    Modified Rankin (Stroke Patients Only)       Balance                                            Cognition Arousal/Alertness: Awake/alert Behavior During Therapy: WFL for tasks assessed/performed Overall Cognitive Status: Within Functional Limits for tasks assessed                                        Exercises Total Joint Exercises Ankle Circles/Pumps:  AROM;Both;10 reps Quad Sets: AROM;Strengthening;Both;10 reps Short Arc Quad: AROM;Strengthening;Right;10 reps Heel Slides: AROM;AAROM;Right;10 reps Hip ABduction/ADduction: AROM;Strengthening;Right;10 reps Long Arc Quad: AROM;Right;10 reps;Strengthening    General Comments        Pertinent Vitals/Pain Pain Assessment: 0-10 Pain Score: 2  Pain Location: right hip and thigh Pain Descriptors / Indicators: Discomfort;Tightness Pain Intervention(s): Limited activity within patient's tolerance;Monitored during session;Premedicated before session;Ice applied    Home Living Family/patient expects to be discharged to:: Private residence Living Arrangements: Spouse/significant other Available Help at Discharge: Family Type of Home: House Home Access: Stairs to enter            Prior Function            PT Goals (current goals can now be found in the care plan section) Acute Rehab PT Goals Patient Stated Goal: to go home, play hockey PT Goal Formulation: With patient/family Time For Goal Achievement: 02/10/17 Potential to Achieve Goals: Good Progress towards PT goals: Progressing toward goals    Frequency    7X/week      PT Plan Current plan remains appropriate    Co-evaluation             End of Session   Activity Tolerance: Patient tolerated treatment well Patient left: in chair;with call bell/phone within reach Nurse Communication: Mobility status PT Visit Diagnosis: Difficulty in walking, not elsewhere classified (R26.2)  Time: 8337-4451 PT Time Calculation (min) (ACUTE ONLY): 27 min  Charges:  $Gait Training: 23-37 mins                    G CodesKenyon Ana, PT Pager: [phone] 02/09/2017    [facility] 02/09/2017, 10:42 AM

## 2017-02-09 NOTE — Progress Notes (Signed)
OT Cancellation Note  Patient Details Name: William Wang MRN: [id number] DOB: 1956-02-25   Cancelled Treatment:    Reason Eval/Treat Not Completed: OT screened, no needs identified, will sign off  Coley Littles 02/09/2017, 11:25 AM  Lesle Chris, OTR/L [phone] 02/09/2017

## 2017-02-09 NOTE — Care Management Note (Signed)
Case Management Note  Patient Details  Name: William Wang MRN: [id number] Date of Birth: 08-04-1956  Subjective/Objective:  61 y.o. M admitted 02/08/2017 for R THA AA. Pt refused DME as he will be using crutches as he used prior to admission. Will be attending Outpt PT. Wife will assist at home. No further CM needs at this time.                   Action/Plan:CM will sign off for now but will be available should additional discharge needs arise or disposition change.    Expected Discharge Date:  02/09/17               Expected Discharge Plan:  Home/Self Care (Outpt PT)  In-House Referral:  NA  Discharge planning Services  CM Consult  Post Acute Care Choice:  NA Choice offered to:  Patient  DME Arranged:  Crutches (Crutches pta) DME Agency:     HH Arranged:  NA HH Agency:  NA  Status of Service:  Completed, signed off  If discussed at [place] of Stay Meetings, dates discussed:    Additional Comments:  Delrae Sawyers, RN 02/09/2017, 9:32 AM

## 2017-02-14 NOTE — Discharge Summary (Signed)
Physician Discharge Summary  Patient ID: William Wang MRN: [id number] DOB/AGE: October 16, 1956 61 y.o.  Admit date: 02/08/2017 Discharge date: 02/09/2017   Procedures:  Procedure(s) (LRB): RIGHT TOTAL HIP ARTHROPLASTY ANTERIOR APPROACH (Right)  Attending Physician:  Dr. Paralee Cancel   Admission Diagnoses:   Right hip primary OA / pain  Discharge Diagnoses:  Principal Problem:   S/P right THA, AA  Past Medical History:  Diagnosis Date  . Aortic arch, right    varicose veins  . Colon polyps 01/2002   hyperplastic sgmoid 51mm; removed and tested   . History of kidney stones   . Hyperlipidemia   . Hypertension   . Hypothyroidism     HPI:    William Wang, 61 y.o. male, has a history of pain and functional disability in the right hip(s) due to arthritis and patient has failed non-surgical conservative treatments for greater than 12 weeks to include NSAID's and/or analgesics, corticosteriod injections and activity modification.  Onset of symptoms was gradual starting ~1.5 years ago with gradually worsening course since that time.The patient noted no past surgery on the right hip(s).  Patient currently rates pain in the right hip at 5 out of 10 with activity. Patient has night pain, worsening of pain with activity and weight bearing, trendelenberg gait, pain that interfers with activities of daily living and pain with passive range of motion. Patient has evidence of periarticular osteophytes and joint space narrowing by imaging studies. This condition presents safety issues increasing the risk of falls.  There is no current active infection.   Risks, benefits and expectations were discussed with the patient.  Risks including but not limited to the risk of anesthesia, blood clots, nerve damage, blood vessel damage, failure of the prosthesis, infection and up to and including death.  Patient understand the risks, benefits and expectations and wishes to proceed with surgery.   PCP: Tivis Ringer, MD   Discharged Condition: good  Hospital Course:  Patient underwent the above stated procedure on 02/08/2017. Patient tolerated the procedure well and brought to the recovery room in good condition and subsequently to the floor.  POD #1 BP: 120/73 ; Pulse: 64 ; Temp: 98.6 F (37 C) ; Resp: 17 Patient reports pain as mild, some discomfort initially which has improved.  No events throughout the night.  Ready to be discharged home.  Dorsiflexion/plantar flexion intact, incision: dressing C/D/I, no cellulitis present and compartment soft.   LABS  Basename    HGB     12.4  HCT     35.8    Discharge Exam: General appearance: alert, cooperative and no distress Extremities: Homans sign is negative, no sign of DVT, no edema, redness or tenderness in the calves or thighs and no ulcers, gangrene or trophic changes  Disposition: Home with follow up in 2 weeks   Follow-up Information    Mauri Pole, MD. Schedule an appointment as soon as possible for a visit in 2 week(s).   Specialty:  Orthopedic Surgery Contact information: [address] [place] [address] [phone]           Discharge Instructions    Call MD / Call 911    Complete by:  As directed    If you experience chest pain or shortness of breath, CALL 911 and be transported to the hospital emergency room.  If you develope a fever above 101 F, pus (white drainage) or increased drainage or redness at the wound, or calf pain, call your surgeon's  office.   Change dressing    Complete by:  As directed    Maintain surgical dressing until follow up in the clinic. If the edges start to pull up, may reinforce with tape. If the dressing is no longer working, may remove and cover with gauze and tape, but must keep the area dry and clean.  Call with any questions or concerns.   Constipation Prevention    Complete by:  As directed    Drink plenty of fluids.  Prune juice may be helpful.  You may use a stool  softener, such as Colace (over the counter) 100 mg twice a day.  Use MiraLax (over the counter) for constipation as needed.   Diet - low sodium heart healthy    Complete by:  As directed    Discharge instructions    Complete by:  As directed    Maintain surgical dressing until follow up in the clinic. If the edges start to pull up, may reinforce with tape. If the dressing is no longer working, may remove and cover with gauze and tape, but must keep the area dry and clean.  Follow up in 2 weeks at [facility]. Call with any questions or concerns.   Increase activity slowly as tolerated    Complete by:  As directed    Weight bearing as tolerated with assist device (walker, cane, etc) as directed, use it as long as suggested by your surgeon or therapist, typically at least 4-6 weeks.   TED hose    Complete by:  As directed    Use stockings (TED hose) for 2 weeks on both leg(s).  You may remove them at night for sleeping.      Allergies as of 02/09/2017   No Known Allergies     Medication List    STOP taking these medications   acetaminophen 500 MG tablet Commonly known as:  TYLENOL   naproxen sodium 220 MG tablet Commonly known as:  ANAPROX     TAKE these medications   aspirin 81 MG chewable tablet Chew 1 tablet (81 mg total) by mouth 2 (two) times daily. Take for 4 weeks.   celecoxib 200 MG capsule Commonly known as:  CELEBREX Take 1 capsule (200 mg total) by mouth every 12 (twelve) hours.   chlorthalidone 25 MG tablet Commonly known as:  HYGROTON Take 25 mg by mouth every other day.   [employer] FIBER GUMMIES PO Take 2 tablets by mouth 2 (two) times daily.   docusate sodium 100 MG capsule Commonly known as:  COLACE Take 1 capsule (100 mg total) by mouth 2 (two) times daily.   ferrous sulfate 325 (65 FE) MG tablet Commonly known as:  FERROUSUL Take 1 tablet (325 mg total) by mouth 3 (three) times daily with meals.   HYDROcodone-acetaminophen 7.5-325 MG  tablet Commonly known as:  NORCO Take 1-2 tablets by mouth every 4 (four) hours as needed for moderate pain or severe pain.   methocarbamol 500 MG tablet Commonly known as:  ROBAXIN Take 1 tablet (500 mg total) by mouth every 6 (six) hours as needed for muscle spasms. What changed:  when to take this  reasons to take this   polyethylene glycol packet Commonly known as:  MIRALAX / GLYCOLAX Take 17 g by mouth 2 (two) times daily.   psyllium 0.52 g capsule Commonly known as:  REGULOID Take 1.56 g by mouth 2 (two) times daily.   rosuvastatin 10 MG tablet Commonly known as:  CRESTOR  Take 10 mg by mouth daily.        Signed: West Pugh. Ahnya Akre   PA-C  02/14/2017, 12:50 PM

## 2017-05-27 NOTE — Anesthesia Postprocedure Evaluation (Signed)
Anesthesia Post Note  Patient: William Wang  Procedure(s) Performed: Procedure(s) (LRB): RIGHT TOTAL HIP ARTHROPLASTY ANTERIOR APPROACH (Right)     Anesthesia Post Evaluation  Last Vitals:  Vitals:   02/09/17 0500 02/09/17 0947  BP: 120/73 140/85  Pulse: 64 70  Resp: 17 16  Temp: 37 C 37.3 C    Last Pain:  Vitals:   02/09/17 1100  TempSrc:   PainSc: 3                  Kimberlly Norgard EDWARD

## 2017-05-27 NOTE — Addendum Note (Signed)
Addendum  created 05/27/17 1345 by Lyndle Herrlich, MD   Sign clinical note

## 2017-09-19 ENCOUNTER — Encounter: Payer: Self-pay | Admitting: *Deleted

## 2019-01-15 IMAGING — DX DG HIP (WITH OR WITHOUT PELVIS) 1V PORT*R*
4 series · 4 of 4 positions shown · non-contrast
Comparison: None in PACs

CLINICAL DATA: Status post right total knee joint replacement.

EXAM:
DG HIP (WITH OR WITHOUT PELVIS) 1V PORT RIGHT

[pelvis ap]
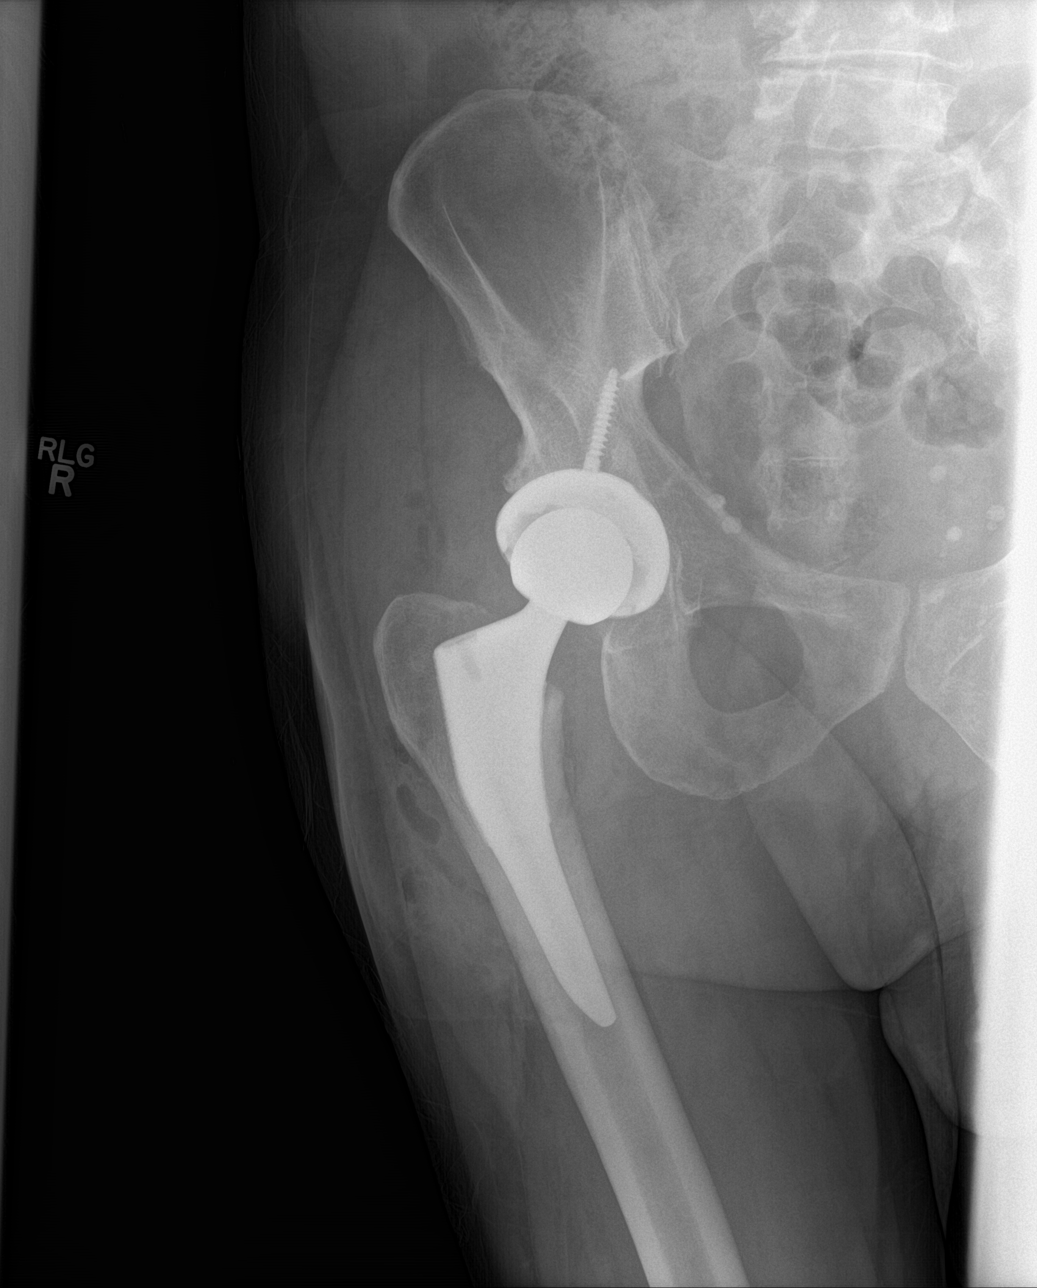

[hip lat (1 of 2)]
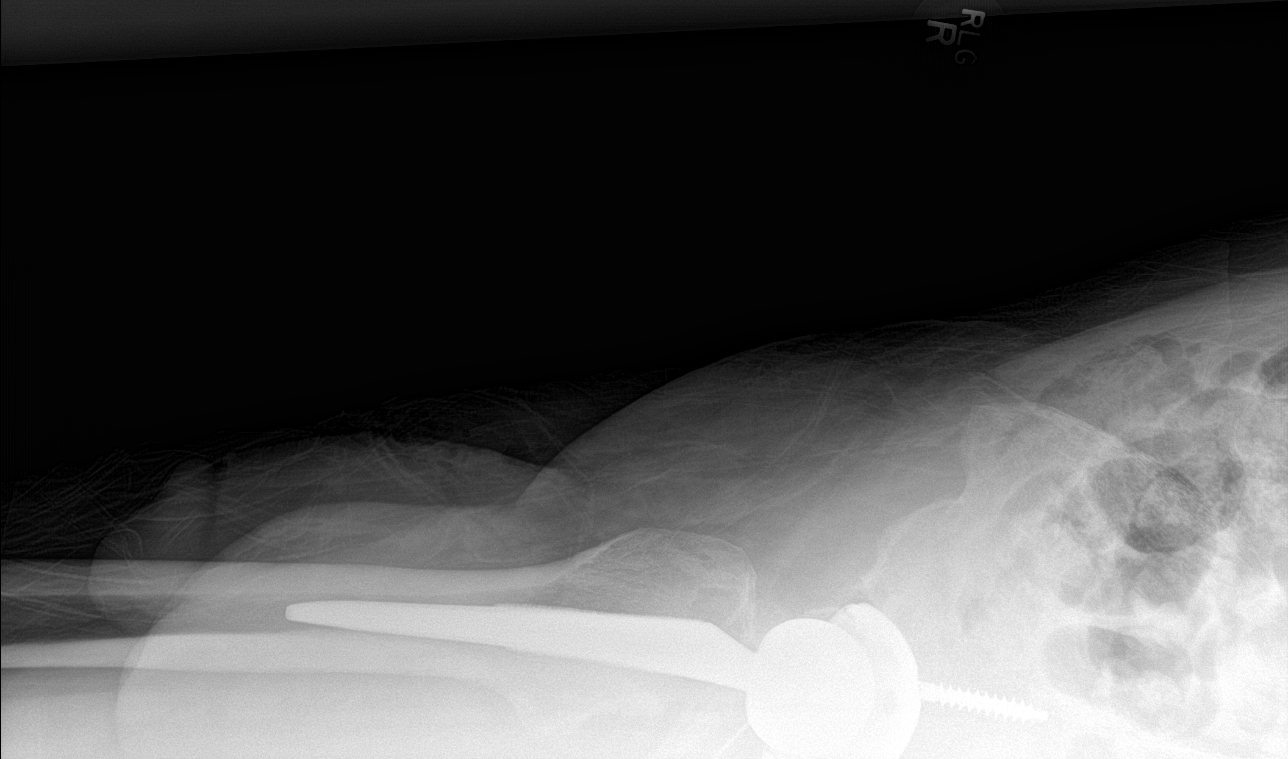

[hip ap]
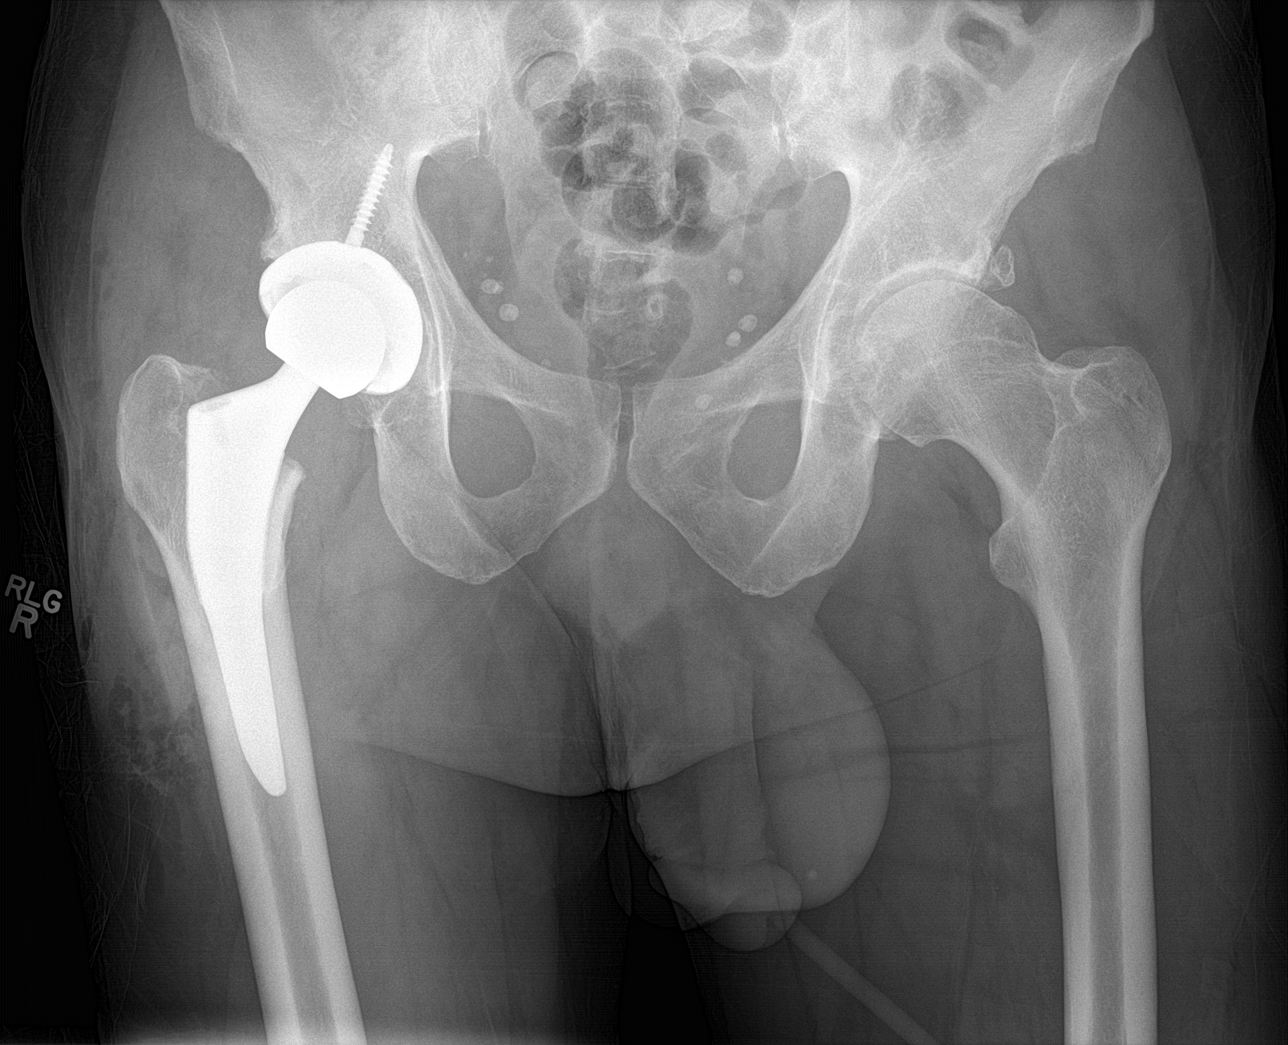

[hip lat (2 of 2)]
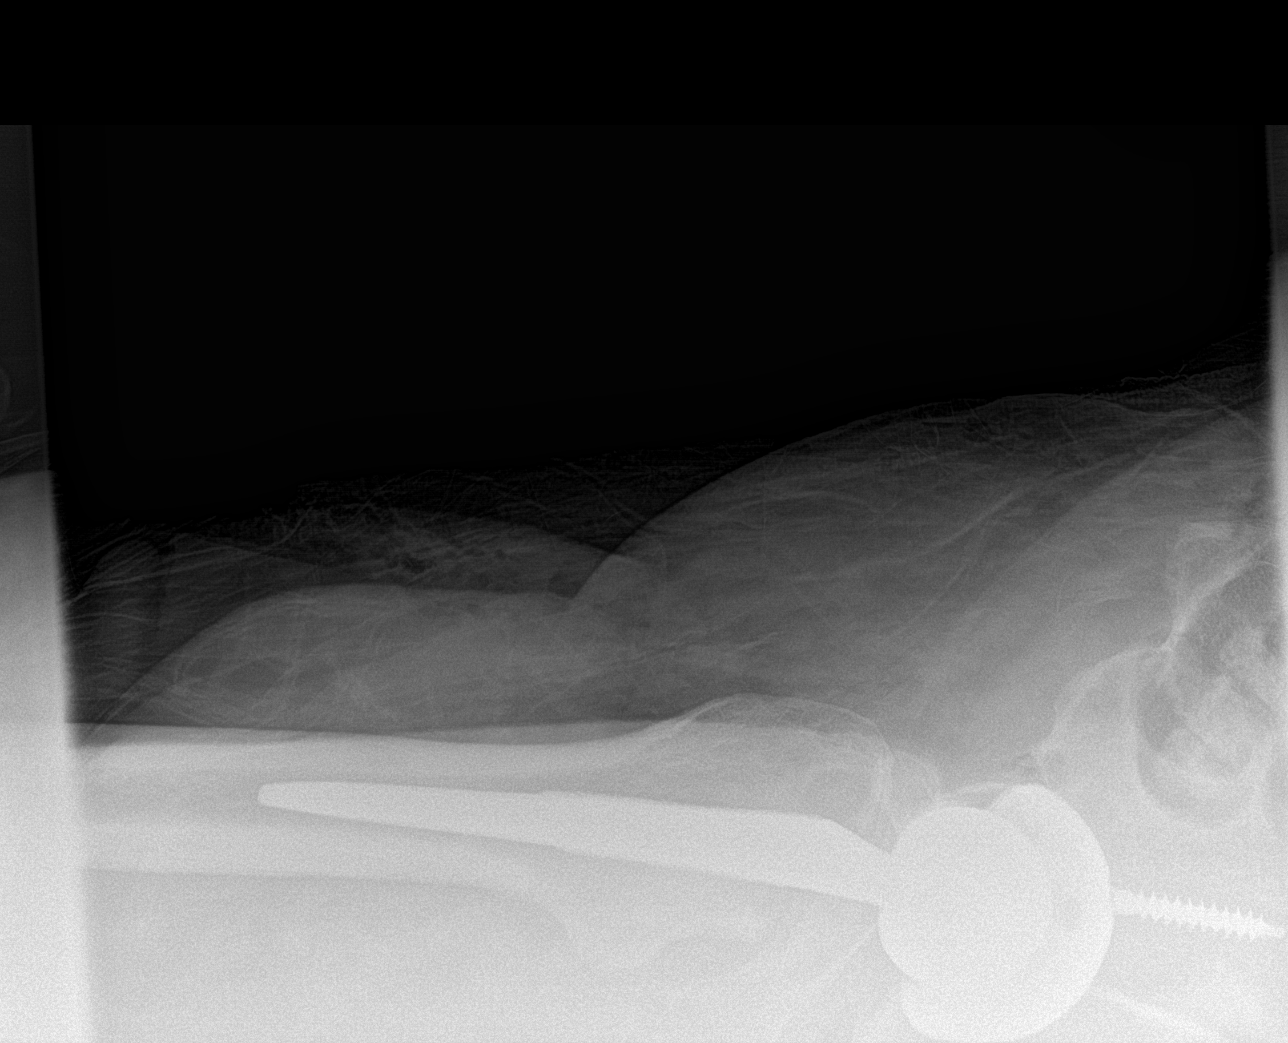

[4 of 4 positions shown; findings below may reference images not displayed]

FINDINGS: The patient has undergone right total hip joint prosthesis
placement. Radiographic positioning of the prosthetic components is
good. The interface with the native bone appears normal. B small
amount of soft tissue gas is noted over the right proximal thigh
laterally.
IMPRESSION: There is no immediate postprocedure complication following right
total hip joint prosthesis placement.

## 2021-07-16 HISTORY — PX: ROBOT ASSISTED LAPAROSCOPIC RADICAL PROSTATECTOMY: SHX5141

## 2022-03-14 ENCOUNTER — Encounter: Payer: Self-pay | Admitting: Internal Medicine

## 2022-03-15 ENCOUNTER — Encounter: Payer: Self-pay | Admitting: Internal Medicine

## 2022-03-19 ENCOUNTER — Encounter: Payer: Self-pay | Admitting: Internal Medicine

## 2022-07-28 ENCOUNTER — Encounter: Payer: Self-pay | Admitting: Internal Medicine

## 2022-08-19 ENCOUNTER — Ambulatory Visit (AMBULATORY_SURGERY_CENTER): Payer: Self-pay

## 2022-08-19 VITALS — Ht 71.0 in | Wt 188.0 lb

## 2022-08-19 DIAGNOSIS — Z1211 Encounter for screening for malignant neoplasm of colon: Secondary | ICD-10-CM

## 2022-08-19 NOTE — Progress Notes (Signed)
No egg or soy allergy known to patient  No issues known to pt with past sedation with any surgeries or procedures Patient denies ever being told they had issues or difficulty with intubation  No FH of Malignant Hyperthermia Pt is not on diet pills Pt is not on  home 02  Pt is not on blood thinners  Pt denies issues with constipation  No A fib or A flutter Have any cardiac testing pending--denied Pt instructed to use Singlecare.com or GoodRx for a price reduction on prep   

## 2022-09-07 ENCOUNTER — Encounter: Payer: Self-pay | Admitting: Internal Medicine

## 2022-09-09 ENCOUNTER — Encounter: Payer: Self-pay | Admitting: Certified Registered Nurse Anesthetist

## 2022-09-15 ENCOUNTER — Ambulatory Visit (AMBULATORY_SURGERY_CENTER): Payer: PRIVATE HEALTH INSURANCE | Admitting: Internal Medicine

## 2022-09-15 ENCOUNTER — Encounter: Payer: Self-pay | Admitting: Internal Medicine

## 2022-09-15 VITALS — BP 115/66 | HR 44 | Temp 96.2°F | Resp 12 | Ht 71.0 in | Wt 188.0 lb

## 2022-09-15 DIAGNOSIS — Z1211 Encounter for screening for malignant neoplasm of colon: Secondary | ICD-10-CM

## 2022-09-15 DIAGNOSIS — D123 Benign neoplasm of transverse colon: Secondary | ICD-10-CM

## 2022-09-15 DIAGNOSIS — D120 Benign neoplasm of cecum: Secondary | ICD-10-CM | POA: Diagnosis not present

## 2022-09-15 DIAGNOSIS — Z86010 Personal history of colonic polyps: Secondary | ICD-10-CM | POA: Insufficient documentation

## 2022-09-15 MED ORDER — SODIUM CHLORIDE 0.9 % IV SOLN: 500.0000 mL | Freq: Once | INTRAVENOUS | Status: DC

## 2022-09-15 NOTE — Progress Notes (Signed)
[facility] History and Physical   Primary Care Physician:  Prince Solian, MD   Reason for Procedure:   CRCA screening  Plan:    Colonoscopy      HPI: William Wang is a 66 y.o. male here for screening exam   Past Medical History:  Diagnosis Date   Aortic arch, right    varicose veins   Colon polyps 01/2002   hyperplastic sgmoid 52m; removed and tested    History of kidney stones    Hyperlipidemia    Hypertension    Hypothyroidism     Past Surgical History:  Procedure Laterality Date   LUMBAR LAMINECTOMY  2002   LUMBAR MICRODISCECTOMY  2017   PELVIC FRACTURE SURGERY  11/1967   no surgery involved; healed on its own   right wrist fracture  07/2011   ROBOT ASSISTED LAPAROSCOPIC RADICAL PROSTATECTOMY  07/2021   TOTAL HIP ARTHROPLASTY Right 02/08/2017   Procedure: RIGHT TOTAL HIP ARTHROPLASTY ANTERIOR APPROACH;  Surgeon: MParalee Cancel MD;  Location: [facility];  Service: Orthopedics;  Laterality: Right;  requests 70 mins   [place]   Prior to Admission medications   Medication Sig Start Date End Date Taking? Authorizing Provider  chlorthalidone (HYGROTON) 25 MG tablet Take 25 mg by mouth every other day. 12/10/16  Yes [provider]  [employer] FIBER GUMMIES PO Take 2 tablets by mouth 2 (two) times daily.   Yes [provider]  rosuvastatin (CRESTOR) 10 MG tablet Take 10 mg by mouth daily. 01/10/17  Yes [provider]  psyllium (REGULOID) 0.52 G capsule Take 1.56 g by mouth 2 (two) times daily.     [provider]    Current Outpatient Medications  Medication Sig Dispense Refill   chlorthalidone (HYGROTON) 25 MG tablet Take 25 mg by mouth every other day.  2   [employer] FIBER GUMMIES PO Take 2 tablets by mouth 2 (two) times daily.     rosuvastatin (CRESTOR) 10 MG tablet Take 10 mg by mouth daily.  6   psyllium (REGULOID) 0.52 G capsule Take 1.56 g by mouth 2 (two) times daily.      Current Facility-Administered Medications   Medication Dose Route Frequency Provider Last Rate Last Admin   0.9 %  sodium chloride infusion  500 mL Intravenous Once GGatha Mayer MD        Allergies as of 09/15/2022   (No Known Allergies)    Family History  Problem Relation Age of Onset   Breast cancer Mother    Hypertension Mother    Ulcers Mother    Colon polyps Father    Ulcers Father    Prostate cancer Father    Crohn's disease Father    Stomach cancer Paternal Grandmother [phone]  Prostate cancer Other    Breast cancer Other    Pancreatic cancer Other    Colon cancer Neg Hx    Rectal cancer Neg Hx     Social History   Socioeconomic History   Marital status: Married    Spouse name: Not on file   Number of children: 3   Years of education: Not on file   Highest education level: Not on file  Occupational History   Occupation: MD    Employer: [employer]    Comment: ER  Tobacco Use   Smoking status: Never   Smokeless tobacco: Never  Substance and Sexual Activity   Alcohol use: Yes    Alcohol/week: 14.0 standard drinks of alcohol  Types: 14 Glasses of wine per week   Drug use: No   Sexual activity: Not on file  Other Topics Concern   Not on file  Social History Narrative   Not on file   Social Determinants of Health   Financial Resource Strain: Not on file  Food Insecurity: Not on file  Transportation Needs: Not on file  Physical Activity: Not on file  Stress: Not on file  Social Connections: Not on file  Intimate Partner Violence: Not on file    Review of Systems:  All other review of systems negative except as mentioned in the HPI.  Physical Exam: Vital signs BP 109/70   Pulse (!) 58   Temp (!) 96.2 F (35.7 C)   Ht '5\' 11"'$  (1.803 m)   Wt 188 lb (85.3 kg)   SpO2 95%   BMI 26.22 kg/m   General:   Alert,  Well-developed, well-nourished, pleasant and cooperative in NAD Lungs:  Clear throughout to auscultation.   Heart:  Regular rate and rhythm; no murmurs, clicks, rubs,  or  gallops. Abdomen:  Soft, nontender and nondistended. Normal bowel sounds.   Neuro/Psych:  Alert and cooperative. Normal mood and affect. A and O x 3   '@Danijah Noh'$  Simonne Maffucci, MD, [facility] [phone] (pager) 09/15/2022 9:44 AM@

## 2022-09-15 NOTE — Progress Notes (Signed)
Report given to PACU, vss 

## 2022-09-15 NOTE — Patient Instructions (Addendum)
I found and removed two very small polyps today. I will let you know pathology results and when to have another routine colonoscopy by mail and/or My Chart.  You also have a condition called diverticulosis - common and not usually a problem. Please read the handout provided.  Internal hemorrhoids were swollen also - commonly seen after prep.  I appreciate the opportunity to care for you. Gatha Mayer, MD, FACG  YOU HAD AN ENDOSCOPIC PROCEDURE TODAY AT [facility]:   Refer to the procedure report that was given to you for any specific questions about what was found during the examination.  If the procedure report does not answer your questions, please call your gastroenterologist to clarify.  If you requested that your care partner not be given the details of your procedure findings, then the procedure report has been included in a sealed envelope for you to review at your convenience later.  YOU SHOULD EXPECT: Some feelings of bloating in the abdomen. Passage of more gas than usual.  Walking can help get rid of the air that was put into your GI tract during the procedure and reduce the bloating. If you had a lower endoscopy (such as a colonoscopy or flexible sigmoidoscopy) you may notice spotting of blood in your stool or on the toilet paper. If you underwent a bowel prep for your procedure, you may not have a normal bowel movement for a few days.  Please Note:  You might notice some irritation and congestion in your nose or some drainage.  This is from the oxygen used during your procedure.  There is no need for concern and it should clear up in a day or so.  SYMPTOMS TO REPORT IMMEDIATELY:  Following lower endoscopy (colonoscopy or flexible sigmoidoscopy):  Excessive amounts of blood in the stool  Significant tenderness or worsening of abdominal pains  Swelling of the abdomen that is new, acute  Fever of 100F or higher  For urgent or emergent issues, a  gastroenterologist can be reached at any hour by calling [phone]. Do not use MyChart messaging for urgent concerns.    DIET:  We do recommend a small meal at first, but then you may proceed to your regular diet.  Drink plenty of fluids but you should avoid alcoholic beverages for 24 hours.  ACTIVITY:  You should plan to take it easy for the rest of today and you should NOT DRIVE or use heavy machinery until tomorrow (because of the sedation medicines used during the test).    FOLLOW UP: Our staff will call the number listed on your records the next business day following your procedure.  We will call around 7:15- 8:00 am to check on you and address any questions or concerns that you may have regarding the information given to you following your procedure. If we do not reach you, we will leave a message.     If any biopsies were taken you will be contacted by phone or by letter within the next 1-3 weeks.  Please call us at [phone] if you have not heard about the biopsies in 3 weeks.    SIGNATURES/CONFIDENTIALITY: You and/or your care partner have signed paperwork which will be entered into your electronic medical record.  These signatures attest to the fact that that the information above on your After Visit Summary has been reviewed and is understood.  Full responsibility of the confidentiality of this discharge information lies with you and/or your care-partner.

## 2022-09-15 NOTE — Progress Notes (Signed)
Pt's states no medical or surgical changes since previsit or office visit. 

## 2022-09-15 NOTE — Progress Notes (Signed)
Called to room to assist during endoscopic procedure.  Patient ID and intended procedure confirmed with present staff. Received instructions for my participation in the procedure from the performing physician.  

## 2022-09-15 NOTE — Op Note (Signed)
[facility] Patient Name: William Wang Procedure Date: 09/15/2022 9:46 AM MRN: [id number] Endoscopist: Gatha Mayer , MD, [id number] Age: 66 Referring MD:  Date of Birth: Jan 16, 1956 Gender: Male Account #: [id number] Procedure:                Colonoscopy Indications:              Screening for colorectal malignant neoplasm, Last                            colonoscopy: 2013 Medicines:                Monitored Anesthesia Care Procedure:                Pre-Anesthesia Assessment:                           - Prior to the procedure, a History and Physical                            was performed, and patient medications and                            allergies were reviewed. The patient's tolerance of                            previous anesthesia was also reviewed. The risks                            and benefits of the procedure and the sedation                            options and risks were discussed with the patient.                            All questions were answered, and informed consent                            was obtained. Prior Anticoagulants: The patient has                            taken no anticoagulant or antiplatelet agents. ASA                            Grade Assessment: II - A patient with mild systemic                            disease. After reviewing the risks and benefits,                            the patient was deemed in satisfactory condition to                            undergo the procedure.  After obtaining informed consent, the colonoscope                            was passed under direct vision. Throughout the                            procedure, the patient's blood pressure, pulse, and                            oxygen saturations were monitored continuously. The                            CF [id number] #[id number] was introduced through the anus                            and advanced to the the cecum, identified  by                            appendiceal orifice and ileocecal valve. The                            colonoscopy was performed without difficulty. The                            patient tolerated the procedure well. The quality                            of the bowel preparation was adequate. The                            ileocecal valve, appendiceal orifice, and rectum                            were photographed. The bowel preparation used was                            Miralax via split dose instruction. Scope In: 9:55:11 AM Scope Out: 10:12:58 AM Scope Withdrawal Time: 0 hours 14 minutes 0 seconds  Total Procedure Duration: 0 hours 17 minutes 47 seconds  Findings:                 The perianal and digital rectal examinations were                            normal. Pertinent negatives include normal prostate                            (size, shape, and consistency).                           Two sessile polyps were found in the distal                            transverse colon and cecum. The polyps were 3 to 5  mm in size. These polyps were removed with a cold                            snare. Resection and retrieval were complete.                            Verification of patient identification for the                            specimen was done. Estimated blood loss was minimal.                           Multiple diverticula were found in the sigmoid                            colon, descending colon and transverse colon.                           Internal hemorrhoids were found.                           The exam was otherwise without abnormality on                            direct and retroflexion views. Complications:            No immediate complications. Estimated Blood Loss:     Estimated blood loss was minimal. Impression:               - Two 3 to 5 mm polyps in the distal transverse                            colon and in the cecum, removed  with a cold snare.                            Resected and retrieved.                           - Diverticulosis in the sigmoid colon, in the                            descending colon and in the transverse colon.                           - Internal hemorrhoids.                           - The examination was otherwise normal on direct                            and retroflexion views. Recommendation:           - Patient has a contact number available for                            emergencies.  The signs and symptoms of potential                            delayed complications were discussed with the                            patient. Return to normal activities tomorrow.                            Written discharge instructions were provided to the                            patient.                           - Resume previous diet.                           - Continue present medications.                           - Repeat colonoscopy is recommended. The                            colonoscopy date will be determined after pathology                            results from today's exam become available for                            review. Gatha Mayer, MD 09/15/2022 10:19:20 AM This report has been signed electronically.

## 2022-09-16 ENCOUNTER — Telehealth: Payer: Self-pay | Admitting: *Deleted

## 2022-09-16 NOTE — Telephone Encounter (Signed)
  Follow up Call-     09/15/2022    8:55 AM  Call back number  Post procedure Call Back phone  # [phone]  Permission to leave phone message Yes    Post procedure follow up phone call. No answer at number given.  Left message on voicemail.

## 2022-09-20 ENCOUNTER — Encounter: Payer: Self-pay | Admitting: Internal Medicine

## 2024-04-02 ENCOUNTER — Other Ambulatory Visit: Payer: Self-pay | Admitting: Medical Genetics

## 2024-04-11 ENCOUNTER — Other Ambulatory Visit (HOSPITAL_COMMUNITY): Payer: PRIVATE HEALTH INSURANCE

## 2024-09-13 ENCOUNTER — Other Ambulatory Visit: Payer: Self-pay | Admitting: Medical Genetics

## 2024-09-13 DIAGNOSIS — Z006 Encounter for examination for normal comparison and control in clinical research program: Secondary | ICD-10-CM

## 2024-10-08 LAB — GENECONNECT MOLECULAR SCREEN: Genetic Analysis Overall Interpretation: NEGATIVE
